# Patient Record
Sex: Female | Born: 1950
Health system: Southern US, Community
[De-identification: ages and names within clinical notes are randomized; demographics above are authoritative.]

## PROBLEM LIST (undated history)

## (undated) DIAGNOSIS — M1990 Unspecified osteoarthritis, unspecified site: Secondary | ICD-10-CM

## (undated) DIAGNOSIS — F329 Major depressive disorder, single episode, unspecified: Secondary | ICD-10-CM

## (undated) DIAGNOSIS — I2510 Atherosclerotic heart disease of native coronary artery without angina pectoris: Secondary | ICD-10-CM

## (undated) DIAGNOSIS — F32A Depression, unspecified: Secondary | ICD-10-CM

## (undated) DIAGNOSIS — K219 Gastro-esophageal reflux disease without esophagitis: Secondary | ICD-10-CM

## (undated) DIAGNOSIS — J40 Bronchitis, not specified as acute or chronic: Secondary | ICD-10-CM

## (undated) HISTORY — PX: HERNIA REPAIR: SHX51

## (undated) HISTORY — PX: DILATION AND CURETTAGE OF UTERUS: SHX78

## (undated) HISTORY — PX: OTHER SURGICAL HISTORY: SHX169

---

## 1998-10-25 ENCOUNTER — Other Ambulatory Visit: Admission: RE | Admit: 1998-10-25 | Discharge: 1998-10-25 | Payer: Self-pay | Admitting: Emergency Medicine

## 1999-12-04 ENCOUNTER — Other Ambulatory Visit: Admission: RE | Admit: 1999-12-04 | Discharge: 1999-12-04 | Payer: Self-pay | Admitting: Emergency Medicine

## 1999-12-12 ENCOUNTER — Encounter: Payer: Self-pay | Admitting: Emergency Medicine

## 1999-12-12 ENCOUNTER — Encounter: Admission: RE | Admit: 1999-12-12 | Discharge: 1999-12-12 | Payer: Self-pay | Admitting: Emergency Medicine

## 2000-04-25 ENCOUNTER — Emergency Department (HOSPITAL_COMMUNITY): Admission: EM | Admit: 2000-04-25 | Discharge: 2000-04-25 | Payer: Self-pay | Admitting: Emergency Medicine

## 2000-12-17 ENCOUNTER — Other Ambulatory Visit: Admission: RE | Admit: 2000-12-17 | Discharge: 2000-12-17 | Payer: Self-pay | Admitting: Emergency Medicine

## 2000-12-31 ENCOUNTER — Encounter: Payer: Self-pay | Admitting: Emergency Medicine

## 2000-12-31 ENCOUNTER — Encounter: Admission: RE | Admit: 2000-12-31 | Discharge: 2000-12-31 | Payer: Self-pay | Admitting: Emergency Medicine

## 2001-01-15 ENCOUNTER — Emergency Department (HOSPITAL_COMMUNITY): Admission: EM | Admit: 2001-01-15 | Discharge: 2001-01-15 | Payer: Self-pay | Admitting: Emergency Medicine

## 2001-01-15 ENCOUNTER — Encounter: Payer: Self-pay | Admitting: Emergency Medicine

## 2002-02-24 ENCOUNTER — Encounter: Admission: RE | Admit: 2002-02-24 | Discharge: 2002-02-24 | Payer: Self-pay | Admitting: Emergency Medicine

## 2002-02-24 ENCOUNTER — Encounter: Payer: Self-pay | Admitting: Emergency Medicine

## 2003-08-29 ENCOUNTER — Encounter: Admission: RE | Admit: 2003-08-29 | Discharge: 2003-08-29 | Payer: Self-pay | Admitting: Emergency Medicine

## 2003-08-30 ENCOUNTER — Encounter: Admission: RE | Admit: 2003-08-30 | Discharge: 2003-08-30 | Payer: Self-pay | Admitting: Emergency Medicine

## 2003-11-07 ENCOUNTER — Ambulatory Visit (HOSPITAL_COMMUNITY): Admission: RE | Admit: 2003-11-07 | Discharge: 2003-11-07 | Payer: Self-pay | Admitting: Chiropractic Medicine

## 2005-09-11 ENCOUNTER — Encounter: Admission: RE | Admit: 2005-09-11 | Discharge: 2005-09-11 | Payer: Self-pay | Admitting: Emergency Medicine

## 2005-09-18 ENCOUNTER — Encounter: Admission: RE | Admit: 2005-09-18 | Discharge: 2005-09-18 | Payer: Self-pay | Admitting: Emergency Medicine

## 2005-11-29 ENCOUNTER — Emergency Department (HOSPITAL_COMMUNITY): Admission: EM | Admit: 2005-11-29 | Discharge: 2005-11-29 | Payer: Self-pay | Admitting: Emergency Medicine

## 2007-11-03 ENCOUNTER — Encounter: Payer: Self-pay | Admitting: Internal Medicine

## 2007-11-08 ENCOUNTER — Ambulatory Visit: Payer: Self-pay | Admitting: Internal Medicine

## 2007-11-08 DIAGNOSIS — R498 Other voice and resonance disorders: Secondary | ICD-10-CM | POA: Insufficient documentation

## 2007-11-08 DIAGNOSIS — R05 Cough: Secondary | ICD-10-CM | POA: Insufficient documentation

## 2007-11-24 ENCOUNTER — Encounter: Payer: Self-pay | Admitting: Internal Medicine

## 2007-12-01 ENCOUNTER — Ambulatory Visit: Payer: Self-pay | Admitting: Internal Medicine

## 2007-12-08 ENCOUNTER — Encounter: Payer: Self-pay | Admitting: Internal Medicine

## 2007-12-08 ENCOUNTER — Ambulatory Visit (HOSPITAL_COMMUNITY): Admission: RE | Admit: 2007-12-08 | Discharge: 2007-12-08 | Payer: Self-pay | Admitting: Internal Medicine

## 2007-12-29 ENCOUNTER — Ambulatory Visit: Payer: Self-pay | Admitting: Internal Medicine

## 2007-12-30 ENCOUNTER — Ambulatory Visit: Payer: Self-pay | Admitting: Internal Medicine

## 2007-12-30 LAB — CONVERTED CEMR LAB
Basophils Absolute: 0 10*3/uL (ref 0.0–0.1)
Basophils Relative: 0.6 % (ref 0.0–1.0)
Eosinophils Absolute: 0.1 10*3/uL (ref 0.0–0.7)
Eosinophils Relative: 1.7 % (ref 0.0–5.0)
HCT: 37.7 % (ref 36.0–46.0)
Hemoglobin: 13.6 g/dL (ref 12.0–15.0)
IgE (Immunoglobulin E), Serum: 2.6 intl units/mL (ref 0.0–180.0)
Lymphocytes Relative: 23 % (ref 12.0–46.0)
MCHC: 36 g/dL (ref 30.0–36.0)
MCV: 93.5 fL (ref 78.0–100.0)
Monocytes Absolute: 0.3 10*3/uL (ref 0.1–1.0)
Monocytes Relative: 7.5 % (ref 3.0–12.0)
Neutro Abs: 3 10*3/uL (ref 1.4–7.7)
Neutrophils Relative %: 67.2 % (ref 43.0–77.0)
Platelets: 240 10*3/uL (ref 150–400)
RBC: 4.03 M/uL (ref 3.87–5.11)
RDW: 12.8 % (ref 11.5–14.6)
WBC: 4.4 10*3/uL — ABNORMAL LOW (ref 4.5–10.5)

## 2008-01-04 ENCOUNTER — Ambulatory Visit: Payer: Self-pay | Admitting: Internal Medicine

## 2008-01-19 ENCOUNTER — Encounter: Payer: Self-pay | Admitting: Internal Medicine

## 2008-02-13 ENCOUNTER — Ambulatory Visit: Payer: Self-pay | Admitting: Internal Medicine

## 2008-04-16 ENCOUNTER — Ambulatory Visit: Payer: Self-pay | Admitting: Internal Medicine

## 2008-07-17 ENCOUNTER — Ambulatory Visit: Payer: Self-pay | Admitting: Family Medicine

## 2008-07-17 DIAGNOSIS — J0100 Acute maxillary sinusitis, unspecified: Secondary | ICD-10-CM

## 2009-01-29 ENCOUNTER — Encounter: Admission: RE | Admit: 2009-01-29 | Discharge: 2009-01-29 | Payer: Self-pay | Admitting: Family Medicine

## 2009-07-02 ENCOUNTER — Ambulatory Visit: Payer: Self-pay | Admitting: Family Medicine

## 2009-07-02 DIAGNOSIS — N3000 Acute cystitis without hematuria: Secondary | ICD-10-CM

## 2009-07-02 LAB — CONVERTED CEMR LAB
Bilirubin Urine: NEGATIVE
Glucose, Urine, Semiquant: NEGATIVE
Ketones, urine, test strip: NEGATIVE
Nitrite: NEGATIVE
Protein, U semiquant: 30
Specific Gravity, Urine: 1.01
Urobilinogen, UA: 0.2
pH: 6

## 2009-07-09 ENCOUNTER — Encounter: Payer: Self-pay | Admitting: Family Medicine

## 2009-12-13 ENCOUNTER — Ambulatory Visit: Payer: Self-pay | Admitting: Family Medicine

## 2009-12-13 DIAGNOSIS — J069 Acute upper respiratory infection, unspecified: Secondary | ICD-10-CM | POA: Insufficient documentation

## 2010-01-30 ENCOUNTER — Encounter: Admission: RE | Admit: 2010-01-30 | Discharge: 2010-01-30 | Payer: Self-pay | Admitting: Family Medicine

## 2010-08-24 ENCOUNTER — Encounter: Payer: Self-pay | Admitting: Emergency Medicine

## 2010-09-04 NOTE — Assessment & Plan Note (Signed)
Summary: Hoarness, L ear pressure, chest congestion x 4 dys rm 1   Vital Signs:  Patient Profile:   60 Years Old Female CC:      Cold & URI symptoms Height:     64.25 inches Weight:      147 pounds O2 Sat:      99 % O2 treatment:    Room Air Temp:     99.7 degrees F oral Pulse rate:   91 / minute Pulse rhythm:   regular Resp:     18 per minute BP sitting:   127 / 75  (right arm) Cuff size:   regular  Vitals Entered By: Areta Haber CMA (Dec 13, 2009 4:16 PM)                  Current Allergies: ! SULFA ! * DUST, TREE POLLEN, GRASSES     History of Present Illness Chief Complaint: Cold & URI symptoms History of Present Illness: Subjective: Patient complains of URI symptoms for 4 to 5 days with mild sore throat, sinus congestion, and non-productive cough.  She has a history of seasonal allergies and requests a refill of her Astepro and QVAR  No pleuritic pain + wheezing + post-nasal drainage No sinus pain/pressure No itchy/red eyes No earache but left ear fees pressure No hemoptysis No SOB No fever/chills No nausea No vomiting No abdominal pain No diarrhea No skin rashes + fatigue No myalgias No headache Used OTC meds without relief   Current Problems: URI (ICD-465.9) ACUTE CYSTITIS (ICD-595.0) ACUTE MAXILLARY SINUSITIS (ICD-461.0) DYSPHONIA (ICD-784.49) COUGH (ICD-786.2)   Current Meds MELOXICAM 15 MG  TABS (MELOXICAM) once daily FOSAMAX 70 MG  TABS (ALENDRONATE SODIUM) once a week QVAR 80 MCG/ACT  AERS (BECLOMETHASONE DIPROPIONATE) once daily as needed PROZAC 20 MG CAPS (FLUOXETINE HCL) 1 tab by mouth once daily MULTIVITAMINS  CAPS (MULTIPLE VITAMIN) QD * VITAMIN B12 QD * CALCIUM 1200mg  QD * GLUCOSAMINE WITH MSR QD MINOXIDIL 2 % SOLN (MINOXIDIL) 2 gtt twice daily ALLEGRA 180 MG TABS (FEXOFENADINE HCL) as needed BENZONATATE 200 MG CAPS (BENZONATATE) One by mouth hs as needed cough PREDNISONE 10 MG TABS (PREDNISONE) 2 PO today, then 2  BID  for 2 days, then 1 two times a day for 2 days, then 1 daily for 2 days.  Take PC AZITHROMYCIN 250 MG TABS (AZITHROMYCIN) Two tabs by mouth on day 1, then 1 tab daily on days 2 through 5 QVAR 80 MCG/ACT AERS (BECLOMETHASONE DIPROPIONATE) 2 inh two times a day ASTEPRO 0.15 % SOLN (AZELASTINE HCL) 2 sprays in each nostril bid  REVIEW OF SYSTEMS Constitutional Symptoms      Denies fever, chills, night sweats, weight loss, weight gain, and fatigue.  Eyes       Denies change in vision, eye pain, eye discharge, glasses, contact lenses, and eye surgery. Ear/Nose/Throat/Mouth       Complains of ear pain and hoarseness.      Denies hearing loss/aids, change in hearing, ear discharge, dizziness, frequent runny nose, frequent nose bleeds, sinus problems, sore throat, and tooth pain or bleeding.      Comments: pressure L x 4 dys Respiratory       Complains of wheezing.      Denies dry cough, productive cough, shortness of breath, asthma, bronchitis, and emphysema/COPD.      Comments: chest congestion Cardiovascular       Denies murmurs, chest pain, and tires easily with exhertion.    Gastrointestinal  Denies stomach pain, nausea/vomiting, diarrhea, constipation, blood in bowel movements, and indigestion. Genitourniary       Denies painful urination, kidney stones, and loss of urinary control. Neurological       Denies paralysis, seizures, and fainting/blackouts. Musculoskeletal       Denies muscle pain, joint pain, joint stiffness, decreased range of motion, redness, swelling, muscle weakness, and gout.  Skin       Denies bruising, unusual mles/lumps or sores, and hair/skin or nail changes.  Psych       Denies mood changes, temper/anger issues, anxiety/stress, speech problems, depression, and sleep problems. Other Comments: Pt jhas not seen PCP for this.   Past History:  Past Medical History: Last updated: 02/13/2008 Mild arthritis Osteopenia Lumbar disc disease Clinical  depression  Past Surgical History: Last updated: 12/30/2007 D&C after miscarriage 1994  Family History: Last updated: 12/30/2007 allergies;;;mother, sisters asthma....children and sisters clotting disorders-sister Mother- living age 45; hypertension, macular den, skin cancer, afib(pace maker) Father- living age 55; artifical valve, hyperlipidemia 3 Sisters- living ages 82- blood clot, age 91, age 41 Brother- living age 31 no medical problems  Social History: Last updated: 11/08/2007 married lives with husband 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  Extraocular movement is intact.  Conjunctivae are not inflamed.  Ears:  Canals normal.  Tympanic membranes normal.   Nose:  Normal septum.  Normal turbinates, mildly congested.    No sinus tenderness present.  Pharynx:  Normal  Neck:  Supple.  No adenopathy is present.  Lungs:  Clear to auscultation.  Breath sounds are equal.  Heart:  Regular rate and rhythm without murmurs, rubs, or gallops.  Abdomen:  Nontender without masses or hepatosplenomegaly.  Bowel sounds are present.  No CVA or flank tenderness.  Extremities:  No edema.  Skin:  No rash Assessment New Problems: URI (ICD-465.9)  VIRAL URI WITH BRONCHOSPASM  Plan New Medications/Changes: ASTEPRO 0.15 % SOLN (AZELASTINE HCL) 2 sprays in each nostril bid  #30cc x 0, 12/13/2009, Donna Christen MD QVAR 80 MCG/ACT AERS (BECLOMETHASONE DIPROPIONATE) 2 inh two times a day  #7.3gm x 0, 12/13/2009, Donna Christen MD AZITHROMYCIN 250 MG TABS (AZITHROMYCIN) Two tabs by mouth on day 1, then 1 tab daily on days 2 through 5  #6 tabs x 0, 12/13/2009, Donna Christen MD PREDNISONE 10 MG TABS (PREDNISONE) 2 PO today,  then 2  BID for 2 days, then 1 two times a day for 2 days, then 1 daily for 2 days.  Take PC  #16 x 0, 12/13/2009, Donna Christen MD BENZONATATE 200 MG CAPS (BENZONATATE) One by mouth hs as needed cough  #12 x 0, 12/13/2009, Donna Christen MD  New Orders: Est. Patient Level III [phone] Planning Comments:   Begin tapering course of prednisone.  Expectorant/decongestant daytime, cough suppressant at bedtime. Rx for Z-pack to hold:  add if not improving in about 5 days or persistent fever develops Refill Astepro and ZVAR. Follow-up with PCP if not improving.   The patient and/or caregiver has been counseled thoroughly with regard to medications prescribed including dosage, schedule, interactions, rationale for use, and possible side effects and they verbalize understanding.  Diagnoses and expected course of recovery discussed and will return if not improved as expected or if  the condition worsens. Patient and/or caregiver verbalized understanding.  Prescriptions: ASTEPRO 0.15 % SOLN (AZELASTINE HCL) 2 sprays in each nostril bid  #30cc x 0   Entered and Authorized by:   Donna Christen MD   Signed by:   Donna Christen MD on 12/13/2009   Method used:   Print then Give to Patient   RxID:   [id number] QVAR 80 MCG/ACT AERS (BECLOMETHASONE DIPROPIONATE) 2 inh two times a day  #7.3gm x 0   Entered and Authorized by:   Donna Christen MD   Signed by:   Donna Christen MD on 12/13/2009   Method used:   Print then Give to Patient   RxID:   [id number] AZITHROMYCIN 250 MG TABS (AZITHROMYCIN) Two tabs by mouth on day 1, then 1 tab daily on days 2 through 5  #6 tabs x 0   Entered and Authorized by:   Donna Christen MD   Signed by:   Donna Christen MD on 12/13/2009   Method used:   Print then Give to Patient   RxID:   [id number] PREDNISONE 10 MG TABS (PREDNISONE) 2 PO today, then 2  BID for 2 days, then 1 two times a day for 2 days, then 1 daily for 2 days.  Take PC  #16 x 0   Entered and  Authorized by:   Donna Christen MD   Signed by:   Donna Christen MD on 12/13/2009   Method used:   Print then Give to Patient   RxID:   [id number] BENZONATATE 200 MG CAPS (BENZONATATE) One by mouth hs as needed cough  #12 x 0   Entered and Authorized by:   Donna Christen MD   Signed by:   Donna Christen MD on 12/13/2009   Method used:   Print then Give to Patient   RxID:   [id number]   Patient Instructions: 1)  May use Mucinex D (guaifenesin with decongestant) twice daily for congestion. 2)  Increase fluid intake, rest. 3)  May use Afrin nasal spray (or generic oxymetazoline) twice daily for about 5 days.  Also recommend using saline nasal spray several times daily and/or saline nasal irrigation. 4)  Stop Allegra D for now. 5)  Add azithromycin if not improving about 5 days. 6)  Followup with family doctor if not improving 7 to 10 days.

## 2011-07-23 ENCOUNTER — Emergency Department
Admission: EM | Admit: 2011-07-23 | Discharge: 2011-07-23 | Disposition: A | Discharge status: Home / Self Care | Payer: 59 | Source: Home / Self Care | Attending: Emergency Medicine | Admitting: Emergency Medicine

## 2011-07-23 ENCOUNTER — Encounter: Payer: Self-pay | Admitting: *Deleted

## 2011-07-23 DIAGNOSIS — J069 Acute upper respiratory infection, unspecified: Secondary | ICD-10-CM

## 2011-07-23 DIAGNOSIS — J329 Chronic sinusitis, unspecified: Secondary | ICD-10-CM

## 2011-07-23 HISTORY — DX: Major depressive disorder, single episode, unspecified: F32.9

## 2011-07-23 HISTORY — DX: Depression, unspecified: F32.A

## 2011-07-23 MED ORDER — AMOXICILLIN 875 MG PO TABS: 875.0000 mg | ORAL_TABLET | Freq: Two times a day (BID) | ORAL | Status: AC

## 2011-07-23 MED ORDER — MOXIFLOXACIN HCL 400 MG PO TABS: 400.0000 mg | ORAL_TABLET | Freq: Every day | ORAL | Status: AC

## 2011-07-23 MED ORDER — BECLOMETHASONE DIPROPIONATE 80 MCG/ACT IN AERS: 2.0000 | INHALATION_SPRAY | Freq: Two times a day (BID) | RESPIRATORY_TRACT | Status: DC

## 2011-07-23 MED ORDER — AZELASTINE HCL 0.1 % NA SOLN: 2.0000 | Freq: Two times a day (BID) | NASAL | Status: DC

## 2011-07-23 NOTE — ED Provider Notes (Signed)
History     CSN: [id number]  Arrival date & time 07/23/11  1221   First MD Initiated Contact with Patient 07/23/11 1250      Chief Complaint  Patient presents with  . Sinusitis    (Consider location/radiation/quality/duration/timing/severity/associated sxs/prior treatment) HPI Kaitlyn Lozano is a 60 y.o. female who complains of onset of cold symptoms for 4 days. She has not had a flu shot. 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 No sore throat +cough No pleuritic pain No wheezing + nasal congestion + post-nasal drainage + And sinus pain/pressure No chest congestion No itchy/red eyes No earache No hemoptysis No SOB No chills/sweats No fever No nausea No vomiting No abdominal pain No diarrhea No skin rashes No fatigue No myalgias No headache    Past Medical History  Diagnosis Date  . Depression     Past Surgical History  Procedure Date  . Dnc     Family History  Problem Relation Age of Onset  . Atrial fibrillation Mother   . Transient ischemic attack Mother     History  Substance Use Topics  . Smoking status: Never Smoker   . Smokeless tobacco: Not on file  . Alcohol Use: Yes    OB History    Grav Para Term Preterm Abortions TAB SAB Ect Mult Living                  Review of Systems  Allergies  Sulfonamide derivatives  Home Medications   Current Outpatient Rx  Name Route Sig Dispense Refill  . FLUOXETINE HCL 20 MG PO TABS Oral Take 20 mg by mouth daily.      . AMOXICILLIN 875 MG PO TABS Oral Take 1 tablet (875 mg total) by mouth 2 (two) times daily. 14 tablet 0  . AZELASTINE HCL 137 MCG/SPRAY NA SOLN Nasal Place 2 sprays into the nose 2 (two) times daily. Use in each nostril as directed 30 mL 2  . BECLOMETHASONE DIPROPIONATE 80 MCG/ACT IN AERS Inhalation Inhale 2 puffs into the lungs 2 (two) times daily. 1 Inhaler 2  . MOXIFLOXACIN HCL 400 MG PO TABS Oral Take 1 tablet (400 mg total) by mouth daily. 10 tablet 0    BP 139/83   Pulse 91  Temp(Src) 99.2 F (37.3 C) (Oral)  Resp 14  Ht 5' 4.5" (1.638 m)  Wt 144 lb (65.318 kg)  BMI 24.34 kg/m2  SpO2 100%  Physical Exam  Nursing note and vitals reviewed. Constitutional: She is oriented to person, place, and time. She appears well-developed and well-nourished.  HENT:  Head: Normocephalic and atraumatic.  Right Ear: Tympanic membrane, external ear and ear canal normal.  Left Ear: Tympanic membrane, external ear and ear canal normal.  Nose: Mucosal edema and rhinorrhea present.  Mouth/Throat: Posterior oropharyngeal erythema present. No oropharyngeal exudate or posterior oropharyngeal edema.  Eyes: No scleral icterus.  Neck: Neck supple.  Cardiovascular: Regular rhythm and normal heart sounds.   Pulmonary/Chest: Effort normal and breath sounds normal. No respiratory distress.  Neurological: She is alert and oriented to person, place, and time.  Skin: Skin is warm and dry.  Psychiatric: She has a normal mood and affect. Her speech is normal.    ED Course  Procedures (including critical care time)  Labs Reviewed - No data to display No results found.   1. Sinusitis   2. Acute upper respiratory infections of unspecified site       MDM  1)  Take the  prescribed antibiotic as instructed.  I also refilled her Astelin and Qvar per her request but she will need to get further refills from her primary care Dr. 2)  Use nasal saline solution (over the counter) at least 3 times a day. 3)  Use over the counter decongestants like Zyrtec-D every 12 hours as needed to help with congestion.  If you have hypertension, do not take medicines with sudafed.  4)  Can take tylenol every 6 hours or motrin every 8 hours for pain or fever. 5)  Follow up with your primary doctor if no improvement in 5-7 days, sooner if increasing pain, fever, or new symptoms.     Lily Kocher, MD 07/23/11 1320

## 2011-07-23 NOTE — ED Notes (Signed)
Taken sudafed and airborne. No fever.

## 2011-09-15 ENCOUNTER — Encounter: Payer: Self-pay | Admitting: *Deleted

## 2011-09-15 ENCOUNTER — Emergency Department
Admission: EM | Admit: 2011-09-15 | Discharge: 2011-09-15 | Disposition: A | Discharge status: Home / Self Care | Payer: 59 | Source: Home / Self Care | Attending: Emergency Medicine | Admitting: Emergency Medicine

## 2011-09-15 DIAGNOSIS — R05 Cough: Secondary | ICD-10-CM

## 2011-09-15 DIAGNOSIS — J209 Acute bronchitis, unspecified: Secondary | ICD-10-CM

## 2011-09-15 HISTORY — DX: Bronchitis, not specified as acute or chronic: J40

## 2011-09-15 MED ORDER — AZITHROMYCIN 250 MG PO TABS: ORAL_TABLET | ORAL | Status: AC

## 2011-09-15 NOTE — ED Notes (Signed)
Patient presents with a cough, wheezing and hoarseness. Cough for 2 day and 1 day for wheezing and hoarseness. Denies fever or chills. She states no OTC medication for symptoms. Symptoms getting worse.

## 2011-09-15 NOTE — ED Provider Notes (Signed)
History     CSN: [id number]  Arrival date & time 09/15/11  1607   First MD Initiated Contact with Patient 09/15/11 1623      Chief Complaint  Patient presents with  . Cough    cough x 2 days with wheezing for 1 day    (Consider location/radiation/quality/duration/timing/severity/associated sxs/prior treatment) HPI Kaitlyn Lozano is a 61 y.o. female who complains of onset of cold symptoms for a few days.  No sore throat + cough No pleuritic pain No wheezing No nasal congestion No post-nasal drainage No sinus pain/pressure + chest congestion No itchy/red eyes No earache No hemoptysis No SOB No chills/sweats No fever No nausea No vomiting No abdominal pain No diarrhea No skin rashes No fatigue No myalgias No headache    Past Medical History  Diagnosis Date  . Depression   . Depression   . Bronchitis     eosinophilic    Past Surgical History  Procedure Date  . Dnc     Family History  Problem Relation Age of Onset  . Atrial fibrillation Mother   . Transient ischemic attack Mother   . Hypertension Mother   . Stroke Mother     History  Substance Use Topics  . Smoking status: Never Smoker   . Smokeless tobacco: Not on file  . Alcohol Use: Yes     rare    OB History    Grav Para Term Preterm Abortions TAB SAB Ect Mult Living                  Review of Systems  All other systems reviewed and are negative.    Allergies  Sulfonamide derivatives  Home Medications   Current Outpatient Rx  Name Route Sig Dispense Refill  . BECLOMETHASONE DIPROPIONATE 80 MCG/ACT IN AERS Inhalation Inhale 2 puffs into the lungs 2 (two) times daily. 1 Inhaler 2  . FLUOXETINE HCL 20 MG PO TABS Oral Take 20 mg by mouth daily.      . AZELASTINE HCL 137 MCG/SPRAY NA SOLN Nasal Place 2 sprays into the nose 2 (two) times daily. Use in each nostril as directed 30 mL 2    BP 147/83  Pulse 86  Temp(Src) 98.3 F (36.8 C) (Oral)  Resp 17  Ht 5\' 4"  (1.626 m)  Wt 145 lb  (65.772 kg)  BMI 24.89 kg/m2  SpO2 98%  Physical Exam  Nursing note and vitals reviewed. Constitutional: She is oriented to person, place, and time. She appears well-developed and well-nourished.  HENT:  Head: Normocephalic and atraumatic.  Right Ear: Tympanic membrane, external ear and ear canal normal.  Left Ear: Tympanic membrane, external ear and ear canal normal.  Nose: Mucosal edema and rhinorrhea present.  Mouth/Throat: Posterior oropharyngeal erythema present. No oropharyngeal exudate or posterior oropharyngeal edema.  Eyes: No scleral icterus.  Neck: Neck supple.  Cardiovascular: Regular rhythm and normal heart sounds.   Pulmonary/Chest: Effort normal. No respiratory distress. She has no wheezes. She has rhonchi (1 rhonchi, cleared with coughing.).  Neurological: She is alert and oriented to person, place, and time.  Skin: Skin is warm and dry.  Psychiatric: She has a normal mood and affect. Her speech is normal.    ED Course  Procedures (including critical care time)  Labs Reviewed - No data to display No results found.   No diagnosis found.    MDM  1)  Take the prescribed antibiotic as instructed. 2)  Use nasal saline solution (over the counter) at least  3 times a day. 3)  Use over the counter decongestants like Zyrtec-D every 12 hours as needed to help with congestion.  If you have hypertension, do not take medicines with sudafed.  4)  Can take tylenol every 6 hours or motrin every 8 hours for pain or fever. 5)  Follow up with your primary doctor if no improvement in 5-7 days, sooner if increasing pain, fever, or new symptoms.     Lily Kocher, MD 09/15/11 [phone]

## 2012-02-24 ENCOUNTER — Other Ambulatory Visit: Payer: Self-pay | Admitting: Family Medicine

## 2012-02-24 DIAGNOSIS — Z1231 Encounter for screening mammogram for malignant neoplasm of breast: Secondary | ICD-10-CM

## 2012-03-07 ENCOUNTER — Ambulatory Visit
Admission: RE | Admit: 2012-03-07 | Discharge: 2012-03-07 | Disposition: A | Discharge status: Home / Self Care | Payer: 59 | Source: Ambulatory Visit | Attending: Family Medicine | Admitting: Family Medicine

## 2012-03-07 DIAGNOSIS — Z1231 Encounter for screening mammogram for malignant neoplasm of breast: Secondary | ICD-10-CM

## 2012-09-21 ENCOUNTER — Encounter: Payer: Self-pay | Admitting: *Deleted

## 2012-09-21 ENCOUNTER — Emergency Department (INDEPENDENT_AMBULATORY_CARE_PROVIDER_SITE_OTHER)
Admission: EM | Admit: 2012-09-21 | Discharge: 2012-09-21 | Disposition: A | Discharge status: Home / Self Care | Payer: 59 | Source: Home / Self Care | Attending: Family Medicine | Admitting: Family Medicine

## 2012-09-21 DIAGNOSIS — S39012A Strain of muscle, fascia and tendon of lower back, initial encounter: Secondary | ICD-10-CM

## 2012-09-21 DIAGNOSIS — S335XXA Sprain of ligaments of lumbar spine, initial encounter: Secondary | ICD-10-CM

## 2012-09-21 DIAGNOSIS — M5136 Other intervertebral disc degeneration, lumbar region: Secondary | ICD-10-CM

## 2012-09-21 MED ORDER — PREDNISONE 50 MG PO TABS: ORAL_TABLET | ORAL | Status: DC

## 2012-09-21 MED ORDER — CYCLOBENZAPRINE HCL 10 MG PO TABS: 10.0000 mg | ORAL_TABLET | Freq: Three times a day (TID) | ORAL | Status: DC | PRN
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
## 2012-09-21 NOTE — ED Notes (Addendum)
Pt c/o right lower back excruciating pain x 1 wk. States she has problems with bulging discs (5). Has tried Aleve with little help. States had a MRI 1 1/2 years ago.

## 2012-10-07 ENCOUNTER — Ambulatory Visit (INDEPENDENT_AMBULATORY_CARE_PROVIDER_SITE_OTHER): Payer: Self-pay | Admitting: General Surgery

## 2012-10-18 ENCOUNTER — Other Ambulatory Visit (INDEPENDENT_AMBULATORY_CARE_PROVIDER_SITE_OTHER): Payer: Self-pay | Admitting: General Surgery

## 2012-10-18 ENCOUNTER — Ambulatory Visit (INDEPENDENT_AMBULATORY_CARE_PROVIDER_SITE_OTHER): Payer: 59 | Admitting: General Surgery

## 2012-10-18 ENCOUNTER — Encounter (INDEPENDENT_AMBULATORY_CARE_PROVIDER_SITE_OTHER): Payer: Self-pay | Admitting: General Surgery

## 2012-10-18 DIAGNOSIS — G8929 Other chronic pain: Secondary | ICD-10-CM

## 2012-10-18 DIAGNOSIS — K439 Ventral hernia without obstruction or gangrene: Secondary | ICD-10-CM

## 2012-10-18 DIAGNOSIS — R1031 Right lower quadrant pain: Secondary | ICD-10-CM

## 2012-10-18 NOTE — Progress Notes (Signed)
Subjective:     Patient ID: Kaitlyn Lozano, female   DOB: 06/22/1951, 62 y.o.   MRN: [id number]  HPI The patient is a 62 year old female who is referred by Dr. Aida Puffer for evaluation ruling out of a spigelian hernia. Patient states she's had back pain he was evaluated by Dr. Clarene Duke secondary to this, however on exam he noticed a bulge in her right lower quadrant. The patient states that she has not had pain is not limited by activities. The only time she does have pain is upon direct palpation needle lateral border  Of her right rectus.  Review of Systems  Constitutional: Negative.   HENT: Negative.   Respiratory: Negative.   Cardiovascular: Negative.   Gastrointestinal: Negative.   Endocrine: Negative.   Neurological: Negative.        Objective:   Physical Exam  Constitutional: She is oriented to person, place, and time. She appears well-developed and well-nourished.  HENT:  Head: Normocephalic and atraumatic.  Eyes: Conjunctivae and EOM are normal. Pupils are equal, round, and reactive to light.  Neck: Normal range of motion. Neck supple.  Cardiovascular: Normal rate, regular rhythm and normal heart sounds.   Pulmonary/Chest: Effort normal and breath sounds normal.  Abdominal: Soft. Bowel sounds are normal. She exhibits no distension and no mass. There is tenderness (RLQ lateral edge of the right rectus). There is no rebound and no guarding.  Musculoskeletal: Normal range of motion.  Neurological: She is alert and oriented to person, place, and time.       Assessment:     62 year old female with possible right spigelian hernia.     Plan:     1. We will have the patient undergo a CT scan of her abdomen and pelvis to evaluate for a possible spigelian hernia. Should this not be diagnostic will have her followup and talk about diagnostic laparoscopy with possible hernia fixation.  2. Followup after CT scan in 1-2 weeks.

## 2012-10-21 ENCOUNTER — Ambulatory Visit
Admission: RE | Admit: 2012-10-21 | Discharge: 2012-10-21 | Disposition: A | Discharge status: Home / Self Care | Payer: 59 | Source: Ambulatory Visit | Attending: General Surgery | Admitting: General Surgery

## 2012-10-21 DIAGNOSIS — K439 Ventral hernia without obstruction or gangrene: Secondary | ICD-10-CM

## 2012-10-21 MED ORDER — IOHEXOL 300 MG/ML  SOLN
100.0000 mL | Freq: Once | INTRAMUSCULAR | Status: AC | PRN
  Administered 2012-10-21: 100 mL via INTRAVENOUS

## 2012-11-09 ENCOUNTER — Ambulatory Visit (INDEPENDENT_AMBULATORY_CARE_PROVIDER_SITE_OTHER): Payer: 59 | Admitting: General Surgery

## 2012-11-09 ENCOUNTER — Encounter (INDEPENDENT_AMBULATORY_CARE_PROVIDER_SITE_OTHER): Payer: Self-pay | Admitting: General Surgery

## 2012-11-09 VITALS — BP 116/82 | HR 73 | Temp 97.1°F | Resp 16 | Ht 66.0 in | Wt 140.4 lb

## 2012-11-09 DIAGNOSIS — K439 Ventral hernia without obstruction or gangrene: Secondary | ICD-10-CM

## 2012-11-09 NOTE — Progress Notes (Signed)
Patient ID: Kaitlyn Lozano, female   DOB: Jul 10, 1951, 62 y.o.   MRN: [id number] A 62 year old female who was recently seen for reevaluation of his beginning hernia. Patient underwent CT scan of the abdomen and pelvis which revealed a normal scan of the abdomen and pelvis. There is no hernia could be seen. There is no subcutaneous mass that could be seen on CT scan. The patient states shea bulge with discomfort. She has some discomfort but no pain.    On exam: The patient does have a slight asymmetry the right side of her lower abdomen. She has no overt pain on palpation. There is no hernia on palpation.  Assessment and plan: Patient is a 62 year old female with a possible spigelian hernia versus subcutaneous mass.  At this point the discomfort does not limited her activities.  The patient would like to continue with watchful waiting at this time. We discussed the possibility to proceed with a diagnostic laparoscopy, but the patient does not feel that the benefits out  weigh the risks of negative diagnostic laparoscopy.    The patient will followup as needed. She will call for a followup appointment in 2-3 months.

## 2012-11-15 ENCOUNTER — Encounter (INDEPENDENT_AMBULATORY_CARE_PROVIDER_SITE_OTHER): Payer: 59 | Admitting: General Surgery

## 2012-11-29 ENCOUNTER — Telehealth (INDEPENDENT_AMBULATORY_CARE_PROVIDER_SITE_OTHER): Payer: Self-pay | Admitting: General Surgery

## 2012-11-29 NOTE — Telephone Encounter (Signed)
         Lozano, Kaitlyn - Dr. Derrell Lolling ','<More Detail >>       Dr. 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   Would like to go ahead and schedule sx. Thx

## 2012-11-29 NOTE — Telephone Encounter (Signed)
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

## 2012-11-30 ENCOUNTER — Telehealth (INDEPENDENT_AMBULATORY_CARE_PROVIDER_SITE_OTHER): Payer: Self-pay | Admitting: General Surgery

## 2012-11-30 NOTE — Telephone Encounter (Signed)
Patient called to see what AR said after I told him her symptoms and wanting to go ahead with surgery.Marland KitchenMarland KitchenI told her that I had discussed everything that she had told me on Monday afternoon such as having a pinching feeling at all times and the hernia was always on her mind... and the surgery orders were in the surgery schedulers box and that they should be calling to set a date within the next couple of days..patient seemed fine with all

## 2012-12-09 ENCOUNTER — Ambulatory Visit (INDEPENDENT_AMBULATORY_CARE_PROVIDER_SITE_OTHER): Payer: 59 | Admitting: General Surgery

## 2012-12-09 ENCOUNTER — Encounter (INDEPENDENT_AMBULATORY_CARE_PROVIDER_SITE_OTHER): Payer: Self-pay | Admitting: General Surgery

## 2012-12-09 DIAGNOSIS — R1031 Right lower quadrant pain: Secondary | ICD-10-CM

## 2012-12-09 DIAGNOSIS — G8929 Other chronic pain: Secondary | ICD-10-CM

## 2012-12-09 NOTE — Progress Notes (Signed)
Patient ID: Kaitlyn Lozano, female   DOB: [birth year], 62 y.o.   MRN: [id number] The patient is a 62 year old female with a questionable spigelian hernia on the right lateral side of her abdomen. The patient wanted more information in regards to the surgery. All questions were answered. The patient was set for surgery on May 23.   the patient requested to speak with the financial advisor in clinic.

## 2012-12-23 DIAGNOSIS — K439 Ventral hernia without obstruction or gangrene: Secondary | ICD-10-CM

## 2012-12-28 ENCOUNTER — Telehealth (INDEPENDENT_AMBULATORY_CARE_PROVIDER_SITE_OTHER): Payer: Self-pay | Admitting: *Deleted

## 2012-12-28 NOTE — Telephone Encounter (Signed)
Patient called extremely upset because she thought she would be able to return to work today and not have any pain.  Patient states she doesn't feel like she can work due to the amount of pain she is having.  Patient states she is going to go home.  Norco 5/325mg  1-2 tablets every 4-6 hours as needed for pain #30 no refills. [facility] [phone].

## 2013-01-06 ENCOUNTER — Encounter (INDEPENDENT_AMBULATORY_CARE_PROVIDER_SITE_OTHER): Payer: Self-pay | Admitting: General Surgery

## 2013-01-06 ENCOUNTER — Ambulatory Visit (INDEPENDENT_AMBULATORY_CARE_PROVIDER_SITE_OTHER): Payer: 59 | Admitting: General Surgery

## 2013-01-06 VITALS — BP 110/68 | HR 77 | Temp 98.2°F | Resp 17 | Ht 64.5 in | Wt 139.2 lb

## 2013-01-06 DIAGNOSIS — Z09 Encounter for follow-up examination after completed treatment for conditions other than malignant neoplasm: Secondary | ICD-10-CM

## 2013-01-06 NOTE — Progress Notes (Signed)
Patient ID: Kaitlyn Lozano, female   DOB: 21-Oct-1950, 62 y.o.   MRN: [id number] The patient is a 62 year old female status post bilateral spigelian hernia repair. The patient initially had pain postoperatively but is since become better. Patient had no nausea or vomiting or fevers while at home. Patient is back to work at this time and doing well.  On exam: Wound is clean dry and intact there is no hernia on palpation  Laparoscopic photos were discussed with the patient in detail.  62 year old female status post fluoroscopic bilateral spigelian hernia repair. 1. We'll have patient followup in 6 weeks. 2. Patient is doing well until she does he does not a followup she can cancel this appointment. 3. Were discussed with the instructions for one more month.  The patient is able 2 resume running/biking/elliptical trainer. Cannot recommend any subsequent use.

## 2013-01-16 ENCOUNTER — Telehealth (INDEPENDENT_AMBULATORY_CARE_PROVIDER_SITE_OTHER): Payer: Self-pay | Admitting: *Deleted

## 2013-01-16 ENCOUNTER — Encounter (INDEPENDENT_AMBULATORY_CARE_PROVIDER_SITE_OTHER): Payer: Self-pay | Admitting: General Surgery

## 2013-01-16 ENCOUNTER — Ambulatory Visit (INDEPENDENT_AMBULATORY_CARE_PROVIDER_SITE_OTHER): Payer: 59 | Admitting: General Surgery

## 2013-01-16 VITALS — BP 116/64 | HR 96 | Temp 98.1°F | Resp 14 | Ht 64.5 in | Wt 141.4 lb

## 2013-01-16 DIAGNOSIS — M62838 Other muscle spasm: Secondary | ICD-10-CM

## 2013-01-16 MED ORDER — DIAZEPAM 5 MG PO TABS: 5.0000 mg | ORAL_TABLET | Freq: Four times a day (QID) | ORAL | Status: DC | PRN

## 2013-01-16 NOTE — Progress Notes (Signed)
Patient ID: Kaitlyn Lozano, female   DOB: 11/03/1950, 63 y.o.   MRN: [id number] The patient is a 62 year old female status post bilateral spigelian hernia repair. The patient states that 2 days ago she had right lower quadrant pain that was on off. She describes a while using her abdominal musculature the pain became more noticeable. She states that it would be sharp and slowly relax. This pain is not constant. She does not describe a sheet or burning pain.  On exam: There is no hernia on palpation.  Assessment and plan: 62 year old female status post lap bilateral spigelian hernia repair At this time I believe the patient does have muscle spasms in her right rectus abdominis muscle where her stitch was to oppose the spigelian hernia. 1. I will give the patient prescription for Valium for muscle relaxer. Should this not help relieve the pain the patient can call back and

## 2013-01-16 NOTE — Telephone Encounter (Signed)
Patient called to state she has been doing well with recovery however beginning yesterday starting having a new pain in her right lower quadrant that she is very concerned about.  Spoke to [place] and Dr. Derrell Lolling who stated to schedule for patient to come on in this morning.  Patient agreeable with POC and on her way at this time.

## 2013-01-24 ENCOUNTER — Ambulatory Visit (INDEPENDENT_AMBULATORY_CARE_PROVIDER_SITE_OTHER): Payer: 59 | Admitting: General Surgery

## 2013-01-24 ENCOUNTER — Encounter (INDEPENDENT_AMBULATORY_CARE_PROVIDER_SITE_OTHER): Payer: Self-pay | Admitting: General Surgery

## 2013-01-24 VITALS — BP 130/80 | HR 72 | Temp 98.4°F | Resp 16 | Ht 64.5 in | Wt 140.2 lb

## 2013-01-24 DIAGNOSIS — Z09 Encounter for follow-up examination after completed treatment for conditions other than malignant neoplasm: Secondary | ICD-10-CM

## 2013-01-24 MED ORDER — GABAPENTIN 300 MG PO CAPS: 300.0000 mg | ORAL_CAPSULE | Freq: Three times a day (TID) | ORAL | Status: DC

## 2013-01-24 MED ORDER — OXYCODONE-ACETAMINOPHEN 10-325 MG PO TABS: 1.0000 | ORAL_TABLET | Freq: Four times a day (QID) | ORAL | Status: DC | PRN

## 2013-01-24 NOTE — Progress Notes (Signed)
Patient ID: Kaitlyn Lozano, female   DOB: 08-13-1950, 62 y.o.   MRN: [id number] The patient is a 62 year old female status post bilateral spigelian hernia repair.  Patient was recently seen secondary a right lower quadrant pain.  The subcutaneous muscle spasm was given Valium for muscle spasms.  The patient's fascia is continued having some burning sensation in her right hernia site. She states that they're certain movements that provoke a burning sensation. The pain slowly resolved after several minutes.  At this time the patient has not begin any exercise activity.  On exam: Wounds are clean dry and intact No hernia on palpation  Assessment and plan: This 62 year old female status post bilateral speglian hernia repair, currently likely post up neuralgia 1 I will give the patient a prescription for Neurontin to help with her pain. 2. At this time I believe it's still very early to require any type of diagnostic laparoscopic procedure. 3. I believe  Her neuralgia will resolve with time. I discussed this with the patient and she is reassured at this time.

## 2013-01-24 NOTE — Addendum Note (Signed)
Addended by: Axel Filler on: 01/24/2013 04:28 PM   Modules accepted: Orders

## 2013-02-22 ENCOUNTER — Ambulatory Visit (INDEPENDENT_AMBULATORY_CARE_PROVIDER_SITE_OTHER): Payer: 59 | Admitting: General Surgery

## 2013-02-22 ENCOUNTER — Encounter (INDEPENDENT_AMBULATORY_CARE_PROVIDER_SITE_OTHER): Payer: Self-pay | Admitting: General Surgery

## 2013-02-22 VITALS — BP 118/68 | HR 72 | Temp 98.2°F | Resp 14 | Ht 64.5 in | Wt 142.6 lb

## 2013-02-22 DIAGNOSIS — Z09 Encounter for follow-up examination after completed treatment for conditions other than malignant neoplasm: Secondary | ICD-10-CM

## 2013-02-22 NOTE — Progress Notes (Signed)
Patient ID: Kaitlyn Lozano, female   DOB: 05-27-1951, 62 y.o.   MRN: [id number] The patient is a 62 year old female status post bilateral Spegellian hernia repair. Patient's previous pain issues are currently resolving. She is on Neurontin and feels that that is helping. She is slowly getting back to her normal activity and is having less pain.   On exam: Wounds are clean dry and intact There is no hernia on palpation  Assessment and plan: 62 year old female status post bilateral inguinal hernia repair. 1. Will have patient follow back when necessary 2. Should the patient in the the refill Neurontin we can refill that for her

## 2013-04-04 ENCOUNTER — Telehealth (INDEPENDENT_AMBULATORY_CARE_PROVIDER_SITE_OTHER): Payer: Self-pay | Admitting: *Deleted

## 2013-04-04 MED ORDER — GABAPENTIN 300 MG PO CAPS: 300.0000 mg | ORAL_CAPSULE | Freq: Three times a day (TID) | ORAL | Status: DC

## 2013-04-04 NOTE — Telephone Encounter (Signed)
Prescription escribed to patient's pharmacy at this time.  Patient updated at this time.

## 2013-04-04 NOTE — Telephone Encounter (Signed)
Patient called to ask for a refill of her Gabapentin.  I do see from the 02/22/13 office note Dr. Derrell Lolling said he would be willing to refill but since it has been a little while just wanted to make sure and also to find out how many to refill with.  Explained to patient Dr. Derrell Lolling will be in clinic this afternoon so I should be able to find out then.  Patient states understanding and agreeable at this time.

## 2013-04-04 NOTE — Telephone Encounter (Signed)
Yes it is OK to refill her Rx. #90

## 2013-05-09 ENCOUNTER — Encounter (INDEPENDENT_AMBULATORY_CARE_PROVIDER_SITE_OTHER): Payer: Self-pay | Admitting: General Surgery

## 2013-05-09 ENCOUNTER — Ambulatory Visit (INDEPENDENT_AMBULATORY_CARE_PROVIDER_SITE_OTHER): Payer: 59 | Admitting: General Surgery

## 2013-05-09 ENCOUNTER — Other Ambulatory Visit (INDEPENDENT_AMBULATORY_CARE_PROVIDER_SITE_OTHER): Payer: Self-pay | Admitting: General Surgery

## 2013-05-09 VITALS — BP 124/72 | HR 92 | Temp 97.8°F | Ht 64.0 in | Wt 145.4 lb

## 2013-05-09 DIAGNOSIS — G8918 Other acute postprocedural pain: Secondary | ICD-10-CM

## 2013-05-09 MED ORDER — GABAPENTIN 300 MG PO CAPS: 300.0000 mg | ORAL_CAPSULE | Freq: Three times a day (TID) | ORAL | Status: DC

## 2013-05-09 NOTE — Progress Notes (Signed)
Patient ID: Kaitlyn Lozano, female   DOB: 12-Jan-1951, 62 y.o.   MRN: [id number] The patient is a 62 year old female status post bilateral spigelian hernia repair. 2 the patient several times postoperatively secondary to abdominal wall pain to the right likely consistent with nerve pain. The patient comes in today in with a chief complaint of right abdominal wall burning/stabbing pain. The patient has been on Neurontin 300 mg twice a day. She states that initially helped approximately 2 weeks and has waned off.     On exam: There is no hernia palpation.  Assessment and plan: 1. I proceeded with I nerve block with 10 cc of Marcaine, 0.25 percent 2. Will give her a refill prescription for Neurontin 30 mg 3 times a day. 3. We will have the patient follow back up in 2 weeks to assess for any further nerve pain.

## 2013-05-30 ENCOUNTER — Encounter (INDEPENDENT_AMBULATORY_CARE_PROVIDER_SITE_OTHER): Payer: Self-pay | Admitting: General Surgery

## 2013-05-30 ENCOUNTER — Ambulatory Visit (INDEPENDENT_AMBULATORY_CARE_PROVIDER_SITE_OTHER): Payer: 59 | Admitting: General Surgery

## 2013-05-30 VITALS — BP 110/64 | HR 68 | Temp 97.6°F | Resp 14 | Ht 64.0 in | Wt 145.6 lb

## 2013-05-30 DIAGNOSIS — G8918 Other acute postprocedural pain: Secondary | ICD-10-CM

## 2013-05-30 NOTE — Progress Notes (Signed)
Patient ID: Kaitlyn Lozano, female   DOB: June 17, 1951, 62 y.o.   MRN: [id number] The patient is a 62 year old female status post speglelianhernia repair x2/bilaterally.  Patient had chronic pain to her right hernia. She continue with Neurontin. After increasing in to 3 times a day she states she's slowly feeling better. She feels that the Marcaine injection unless this not help her pain.  Assessment and plan: 1. We'll continue with Neurontin at this time 3 times a day.  She has refills and will call us back if she needs more 2.she is currently 5 months out from surgery. We can refer to her chronic pain specialist should her pain continued. She will call us at the time she wants to be referred. 3. Patient to follow up as needed

## 2013-07-14 ENCOUNTER — Telehealth (INDEPENDENT_AMBULATORY_CARE_PROVIDER_SITE_OTHER): Payer: Self-pay | Admitting: *Deleted

## 2013-07-14 ENCOUNTER — Other Ambulatory Visit (INDEPENDENT_AMBULATORY_CARE_PROVIDER_SITE_OTHER): Payer: Self-pay | Admitting: *Deleted

## 2013-07-14 MED ORDER — GABAPENTIN 300 MG PO CAPS: 600.0000 mg | ORAL_CAPSULE | Freq: Two times a day (BID) | ORAL | Status: DC

## 2013-07-14 NOTE — Telephone Encounter (Signed)
Patient called to ask for a refill of her Gabapentin.  Patient states Dr. Derrell Lolling had told her she could take 600mg  BID so she is about to run out.  Spoke to Dr. Derrell Lolling in office who approved for 1 more month no refills.  Prescription escribed to patients pharmacy at this time.

## 2013-08-14 ENCOUNTER — Other Ambulatory Visit (INDEPENDENT_AMBULATORY_CARE_PROVIDER_SITE_OTHER): Payer: Self-pay

## 2013-08-14 DIAGNOSIS — M792 Neuralgia and neuritis, unspecified: Secondary | ICD-10-CM

## 2013-08-14 MED ORDER — GABAPENTIN 300 MG PO CAPS: 600.0000 mg | ORAL_CAPSULE | Freq: Two times a day (BID) | ORAL | Status: DC

## 2013-11-07 ENCOUNTER — Other Ambulatory Visit: Payer: Self-pay | Admitting: Family Medicine

## 2013-11-07 DIAGNOSIS — N6459 Other signs and symptoms in breast: Secondary | ICD-10-CM

## 2013-11-23 ENCOUNTER — Ambulatory Visit
Admission: RE | Admit: 2013-11-23 | Discharge: 2013-11-23 | Disposition: A | Discharge status: Home / Self Care | Payer: 59 | Source: Ambulatory Visit | Attending: Family Medicine | Admitting: Family Medicine

## 2013-11-23 ENCOUNTER — Encounter (INDEPENDENT_AMBULATORY_CARE_PROVIDER_SITE_OTHER): Payer: Self-pay

## 2013-11-23 DIAGNOSIS — N6459 Other signs and symptoms in breast: Secondary | ICD-10-CM

## 2014-07-04 ENCOUNTER — Emergency Department (INDEPENDENT_AMBULATORY_CARE_PROVIDER_SITE_OTHER)
Admission: EM | Admit: 2014-07-04 | Discharge: 2014-07-04 | Disposition: A | Discharge status: Home / Self Care | Payer: 59 | Source: Home / Self Care | Attending: Emergency Medicine | Admitting: Emergency Medicine

## 2014-07-04 ENCOUNTER — Encounter: Payer: Self-pay | Admitting: *Deleted

## 2014-07-04 DIAGNOSIS — J208 Acute bronchitis due to other specified organisms: Secondary | ICD-10-CM

## 2014-07-04 MED ORDER — HYDROCOD POLST-CHLORPHEN POLST 10-8 MG/5ML PO LQCR: 5.0000 mL | Freq: Two times a day (BID) | ORAL | Status: DC

## 2014-07-04 MED ORDER — AZITHROMYCIN 250 MG PO TABS: 250.0000 mg | ORAL_TABLET | Freq: Every day | ORAL | Status: DC

## 2014-07-04 MED ORDER — PREDNISONE 10 MG PO TABS: ORAL_TABLET | ORAL | Status: DC

## 2014-07-04 NOTE — ED Notes (Signed)
August c/o cough, productive at times x 3 weeks. Denies SOB or fever.

## 2014-07-04 NOTE — Discharge Instructions (Signed)

## 2014-07-05 NOTE — ED Provider Notes (Signed)
CSN: [id number]     Arrival date & time 07/04/14  1645 History   First MD Initiated Contact with Patient 07/04/14 1751     Chief Complaint  Patient presents with  . Cough     (Consider location/radiation/quality/duration/timing/severity/associated sxs/prior Treatment) Patient is a 63 y.o. female presenting with cough. The history is provided by the patient. No language interpreter was used.  Cough Severity:  Moderate Onset quality:  Gradual Duration:  3 weeks Timing:  Constant Progression:  Worsening Chronicity:  New Smoker: no   Context: upper respiratory infection   Relieved by:  Nothing Worsened by:  Nothing tried Ineffective treatments:  None tried Associated symptoms: sore throat   Risk factors: no recent infection     Past Medical History  Diagnosis Date  . Depression   . Depression   . Bronchitis     eosinophilic   Past Surgical History  Procedure Laterality Date  . Dnc    . Dilation and curettage of uterus  1993-1994  . Hernia repair     Family History  Problem Relation Age of Onset  . Atrial fibrillation Mother   . Transient ischemic attack Mother   . Hypertension Mother   . Stroke Mother    History  Substance Use Topics  . Smoking status: Never Smoker   . Smokeless tobacco: Never Used  . Alcohol Use: Yes     Comment: rare   OB History    No data available     Review of Systems  HENT: Positive for sore throat.   Respiratory: Positive for cough.   All other systems reviewed and are negative.     Allergies  Sulfonamide derivatives  Home Medications   Prior to Admission medications   Medication Sig Start Date End Date Taking? Authorizing Provider  azithromycin (ZITHROMAX) 250 MG tablet Take 1 tablet (250 mg total) by mouth daily. Take first 2 tablets together, then 1 every day until finished. 07/04/14   Fransico Meadow, PA-C  CALCIUM-VITAMIN D PO Take by mouth 2 (two) times daily.    Historical Provider, MD  chlorpheniramine-HYDROcodone  (TUSSIONEX PENNKINETIC ER) 10-8 MG/5ML LQCR Take 5 mLs by mouth 2 (two) times daily. 07/04/14   Fransico Meadow, PA-C  citalopram (CELEXA) 20 MG tablet  08/09/12   Historical Provider, MD  gabapentin (NEURONTIN) 300 MG capsule Take 2 capsules (600 mg total) by mouth 2 (two) times daily. 08/14/13   Ralene Ok, MD  Multiple Vitamins-Minerals (MULTIVITAMIN WITH MINERALS) tablet Take 1 tablet by mouth daily.    Historical Provider, MD  predniSONE (DELTASONE) 10 MG tablet 6,5,4,3,2,1 taper 07/04/14   Fransico Meadow, PA-C  Turmeric 500 MG CAPS Take 500 mg by mouth daily.    Historical Provider, MD  vitamin B-12 (CYANOCOBALAMIN) 500 MCG tablet Take 500 mcg by mouth daily.    Historical Provider, MD  vitamin C (ASCORBIC ACID) 500 MG tablet Take 500 mg by mouth daily.    Historical Provider, MD   BP 135/73 mmHg  Pulse 79  Temp(Src) 98.4 F (36.9 C) (Oral)  Resp 16  Wt 146 lb (66.225 kg)  SpO2 100% Physical Exam  Constitutional: She is oriented to person, place, and time. She appears well-developed and well-nourished.  HENT:  Head: Normocephalic and atraumatic.  Right Ear: External ear normal.  Eyes: Conjunctivae and EOM are normal. Pupils are equal, round, and reactive to light.  Neck: Normal range of motion.  Cardiovascular: Normal rate and normal heart sounds.   Pulmonary/Chest: Effort  normal and breath sounds normal.  Abdominal: Soft. She exhibits no distension.  Musculoskeletal: Normal range of motion.  Neurological: She is alert and oriented to person, place, and time.  Skin: Skin is warm.  Psychiatric: She has a normal mood and affect.  Nursing note and vitals reviewed.   ED Course  Procedures (including critical care time) Labs Review Labs Reviewed - No data to display  Imaging Review No results found.   EKG Interpretation None      MDM  I doubt pneumonia,  Afebrile and lungs clear.    Final diagnoses:  Acute bronchitis due to other specified organisms    Pt given rx  for tussionex, prednisone  and zithromax.   Pt advised to see her MD for recheck.   She is advised she may need a chest xray if symptoms persist past one week AVS    Fransico Meadow, PA-C 07/05/14 1100

## 2015-07-21 ENCOUNTER — Emergency Department (HOSPITAL_COMMUNITY): Payer: 59

## 2015-07-21 ENCOUNTER — Encounter (HOSPITAL_COMMUNITY): Payer: Self-pay | Admitting: Emergency Medicine

## 2015-07-21 ENCOUNTER — Inpatient Hospital Stay (HOSPITAL_COMMUNITY)
Admission: EM | Admit: 2015-07-21 | Discharge: 2015-07-24 | DRG: 247 | Disposition: A | Discharge status: Home / Self Care | Payer: 59 | Attending: Internal Medicine | Admitting: Internal Medicine

## 2015-07-21 DIAGNOSIS — I214 Non-ST elevation (NSTEMI) myocardial infarction: Principal | ICD-10-CM | POA: Diagnosis present

## 2015-07-21 DIAGNOSIS — I97630 Postprocedural hematoma of a circulatory system organ or structure following a cardiac catheterization: Secondary | ICD-10-CM

## 2015-07-21 DIAGNOSIS — Z955 Presence of coronary angioplasty implant and graft: Secondary | ICD-10-CM

## 2015-07-21 DIAGNOSIS — Y840 Cardiac catheterization as the cause of abnormal reaction of the patient, or of later complication, without mention of misadventure at the time of the procedure: Secondary | ICD-10-CM | POA: Diagnosis not present

## 2015-07-21 DIAGNOSIS — Z882 Allergy status to sulfonamides status: Secondary | ICD-10-CM

## 2015-07-21 DIAGNOSIS — I97410 Intraoperative hemorrhage and hematoma of a circulatory system organ or structure complicating a cardiac catheterization: Secondary | ICD-10-CM | POA: Diagnosis not present

## 2015-07-21 DIAGNOSIS — Z8249 Family history of ischemic heart disease and other diseases of the circulatory system: Secondary | ICD-10-CM

## 2015-07-21 DIAGNOSIS — R9431 Abnormal electrocardiogram [ECG] [EKG]: Secondary | ICD-10-CM | POA: Diagnosis present

## 2015-07-21 DIAGNOSIS — F32A Depression, unspecified: Secondary | ICD-10-CM | POA: Diagnosis present

## 2015-07-21 DIAGNOSIS — Z006 Encounter for examination for normal comparison and control in clinical research program: Secondary | ICD-10-CM

## 2015-07-21 DIAGNOSIS — I249 Acute ischemic heart disease, unspecified: Secondary | ICD-10-CM | POA: Diagnosis present

## 2015-07-21 DIAGNOSIS — F329 Major depressive disorder, single episode, unspecified: Secondary | ICD-10-CM | POA: Diagnosis present

## 2015-07-21 DIAGNOSIS — I2510 Atherosclerotic heart disease of native coronary artery without angina pectoris: Secondary | ICD-10-CM

## 2015-07-21 DIAGNOSIS — R0789 Other chest pain: Secondary | ICD-10-CM | POA: Diagnosis not present

## 2015-07-21 DIAGNOSIS — R079 Chest pain, unspecified: Secondary | ICD-10-CM | POA: Diagnosis present

## 2015-07-21 DIAGNOSIS — R0602 Shortness of breath: Secondary | ICD-10-CM | POA: Diagnosis present

## 2015-07-21 DIAGNOSIS — I25110 Atherosclerotic heart disease of native coronary artery with unstable angina pectoris: Secondary | ICD-10-CM | POA: Diagnosis present

## 2015-07-21 HISTORY — DX: Gastro-esophageal reflux disease without esophagitis: K21.9

## 2015-07-21 HISTORY — DX: Atherosclerotic heart disease of native coronary artery without angina pectoris: I25.10

## 2015-07-21 HISTORY — DX: Unspecified osteoarthritis, unspecified site: M19.90

## 2015-07-21 LAB — I-STAT TROPONIN, ED
Troponin i, poc: 0.01 ng/mL (ref 0.00–0.08)
Troponin i, poc: 0.01 ng/mL (ref 0.00–0.08)

## 2015-07-21 LAB — BASIC METABOLIC PANEL
Anion gap: 10 (ref 5–15)
BUN: 15 mg/dL (ref 6–20)
CALCIUM: 9.4 mg/dL (ref 8.9–10.3)
CHLORIDE: 102 mmol/L (ref 101–111)
CO2: 26 mmol/L (ref 22–32)
CREATININE: 0.87 mg/dL (ref 0.44–1.00)
GFR calc non Af Amer: >60 mL/min (ref >60)
GLUCOSE: 86 mg/dL (ref 65–99)
Potassium: 4.5 mmol/L (ref 3.5–5.1)
Sodium: 138 mmol/L (ref 135–145)

## 2015-07-21 LAB — CBC
HCT: 43.6 % (ref 36.0–46.0)
Hemoglobin: 15.5 g/dL — ABNORMAL HIGH (ref 12.0–15.0)
MCH: 33.3 pg (ref 26.0–34.0)
MCHC: 35.6 g/dL (ref 30.0–36.0)
MCV: 93.8 fL (ref 78.0–100.0)
PLATELETS: 345 10*3/uL (ref 150–400)
RBC: 4.65 MIL/uL (ref 3.87–5.11)
RDW: 14.8 % (ref 11.5–15.5)
WBC: 13.9 10*3/uL — ABNORMAL HIGH (ref 4.0–10.5)

## 2015-07-21 LAB — TROPONIN I: Troponin I: 0.09 ng/mL — ABNORMAL HIGH (ref <0.031)

## 2015-07-21 LAB — D-DIMER, QUANTITATIVE: D-Dimer, Quant: 0.33 ug{FEU}/mL (ref 0.00–0.50)

## 2015-07-21 MED ORDER — MORPHINE SULFATE (PF) 4 MG/ML IV SOLN
4.0000 mg | Freq: Once | INTRAVENOUS | Status: DC
  Filled 2015-07-21: qty 1

## 2015-07-21 MED ORDER — NITROGLYCERIN 0.4 MG/HR TD PT24: 0.4000 mg | MEDICATED_PATCH | Freq: Once | TRANSDERMAL | Status: DC | PRN

## 2015-07-21 MED ORDER — ONDANSETRON HCL 4 MG/2ML IJ SOLN: 4.0000 mg | Freq: Four times a day (QID) | INTRAMUSCULAR | Status: DC | PRN

## 2015-07-21 MED ORDER — TURMERIC 500 MG PO CAPS: 500.0000 mg | ORAL_CAPSULE | Freq: Every day | ORAL | Status: DC

## 2015-07-21 MED ORDER — NITROGLYCERIN 2 % TD OINT
1.0000 [in_us] | TOPICAL_OINTMENT | Freq: Once | TRANSDERMAL | Status: AC
  Administered 2015-07-22: 1 [in_us] via TOPICAL
  Filled 2015-07-21: qty 30

## 2015-07-21 MED ORDER — GI COCKTAIL ~~LOC~~
30.0000 mL | Freq: Four times a day (QID) | ORAL | Status: DC | PRN
Start: 2015-07-21 — End: 2015-07-24
  Filled 2015-07-21: qty 30

## 2015-07-21 MED ORDER — MORPHINE SULFATE (PF) 2 MG/ML IV SOLN: 2.0000 mg | INTRAVENOUS | Status: DC | PRN

## 2015-07-21 MED ORDER — NITROGLYCERIN 0.4 MG SL SUBL
0.4000 mg | SUBLINGUAL_TABLET | SUBLINGUAL | Status: DC | PRN
  Administered 2015-07-21: 0.4 mg via SUBLINGUAL
  Filled 2015-07-21: qty 1

## 2015-07-21 MED ORDER — ASPIRIN 81 MG PO CHEW
324.0000 mg | CHEWABLE_TABLET | Freq: Once | ORAL | Status: AC
  Administered 2015-07-21: 324 mg via ORAL
  Filled 2015-07-21: qty 4

## 2015-07-21 MED ORDER — HEPARIN SODIUM (PORCINE) 5000 UNIT/ML IJ SOLN
5000.0000 [IU] | Freq: Three times a day (TID) | INTRAMUSCULAR | Status: DC
  Administered 2015-07-21: 5000 [IU] via SUBCUTANEOUS
  Filled 2015-07-21: qty 1

## 2015-07-21 MED ORDER — VITAMIN B-12 1000 MCG PO TABS
1000.0000 ug | ORAL_TABLET | Freq: Every day | ORAL | Status: DC
  Administered 2015-07-22 – 2015-07-24 (×2): 1000 ug via ORAL
  Filled 2015-07-21 (×2): qty 1

## 2015-07-21 MED ORDER — ACETAMINOPHEN 325 MG PO TABS
650.0000 mg | ORAL_TABLET | ORAL | Status: DC | PRN
  Administered 2015-07-22: 650 mg via ORAL
  Filled 2015-07-21: qty 2

## 2015-07-21 MED ORDER — VITAMIN C 500 MG PO TABS
500.0000 mg | ORAL_TABLET | Freq: Every day | ORAL | Status: DC
  Administered 2015-07-22 – 2015-07-24 (×3): 500 mg via ORAL
  Filled 2015-07-21 (×3): qty 1

## 2015-07-21 MED ORDER — VITAMIN D 1000 UNITS PO TABS
1000.0000 [IU] | ORAL_TABLET | Freq: Every day | ORAL | Status: DC
Start: 2015-07-22 — End: 2015-07-24
  Administered 2015-07-22 – 2015-07-24 (×3): 1000 [IU] via ORAL
  Filled 2015-07-21 (×3): qty 1

## 2015-07-21 MED ORDER — NITROGLYCERIN 0.4 MG SL SUBL
0.4000 mg | SUBLINGUAL_TABLET | SUBLINGUAL | Status: DC | PRN
  Administered 2015-07-21 – 2015-07-23 (×2): 0.4 mg via SUBLINGUAL
  Filled 2015-07-21: qty 1

## 2015-07-21 MED ORDER — NITROGLYCERIN 2 % TD OINT
1.0000 [in_us] | TOPICAL_OINTMENT | Freq: Once | TRANSDERMAL | Status: DC
  Filled 2015-07-21 (×2): qty 1

## 2015-07-21 MED ORDER — HEPARIN (PORCINE) IN NACL 100-0.45 UNIT/ML-% IJ SOLN
700.0000 [IU]/h | INTRAMUSCULAR | Status: DC
  Administered 2015-07-22: 750 [IU]/h via INTRAVENOUS
  Filled 2015-07-21: qty 250

## 2015-07-21 MED ORDER — CITALOPRAM HYDROBROMIDE 20 MG PO TABS
20.0000 mg | ORAL_TABLET | Freq: Every day | ORAL | Status: DC
  Administered 2015-07-22 – 2015-07-24 (×3): 20 mg via ORAL
  Filled 2015-07-21 (×3): qty 1

## 2015-07-21 MED ORDER — ADULT MULTIVITAMIN W/MINERALS CH
1.0000 | ORAL_TABLET | Freq: Every day | ORAL | Status: DC
  Filled 2015-07-21: qty 1

## 2015-07-21 NOTE — Progress Notes (Signed)
ANTICOAGULATION CONSULT NOTE - Initial Consult  Pharmacy Consult for heparin Indication: chest pain/ACS  Allergies  Allergen Reactions  . Sulfa Antibiotics Rash  . Sulfonamide Derivatives Rash    Patient Measurements: Height: 5\' 4"  (162.6 cm) Weight: 140 lb (63.504 kg) IBW/kg (Calculated) : 54.7  Vital Signs: Temp: 97.9 F (36.6 C) (12/18 2203) Temp Source: Oral (12/18 2203) BP: 111/59 mmHg (12/18 2203) Pulse Rate: 92 (12/18 2203)  Labs:  Recent Labs  07/21/15 1228 07/21/15 2142  HGB 15.5*  --   HCT 43.6  --   PLT 345  --   CREATININE 0.87  --   TROPONINI  --  0.09*    Estimated Creatinine Clearance: 55.7 mL/min (by C-G formula based on Cr of 0.87).   Medical History: Past Medical History  Diagnosis Date  . Depression   . Depression   . Bronchitis     eosinophilic    Medications:  Prescriptions prior to admission  Medication Sig Dispense Refill Last Dose  . acetaminophen (TYLENOL) 650 MG CR tablet Take 1,300 mg by mouth at bedtime as needed for pain.   1-2 weeks ago  . Calcium Citrate-Vitamin D (CITRACAL + D PO) Take 1 tablet by mouth 2 (two) times daily.   07/21/2015 at am  . Cholecalciferol (VITAMIN D PO) Take 1 tablet by mouth daily.   07/21/2015 at Unknown time  . citalopram (CELEXA) 20 MG tablet Take 20 mg by mouth daily.    07/21/2015 at Unknown time  . Multiple Vitamins-Minerals (MULTIVITAMIN WITH MINERALS) tablet Take 1 tablet by mouth daily. Centrum Silver   07/21/2015 at Unknown time  . Turmeric 500 MG CAPS Take 500 mg by mouth daily.   07/21/2015 at Unknown time  . vitamin B-12 (CYANOCOBALAMIN) 1000 MCG tablet Take 1,000 mcg by mouth daily.   07/21/2015 at Unknown time  . vitamin C (ASCORBIC ACID) 500 MG tablet Take 500 mg by mouth daily.   07/21/2015 at Unknown time   Scheduled:  . [START ON 07/22/2015] cholecalciferol  1,000 Units Oral Daily  . [START ON 07/22/2015] citalopram  20 mg Oral Daily  . heparin  5,000 Units Subcutaneous 3 times  per day  .  morphine injection  4 mg Intravenous Once  . [START ON 07/22/2015] multivitamin with minerals  1 tablet Oral Daily  . nitroGLYCERIN  1 inch Topical Once  . [START ON 07/22/2015] vitamin B-12  1,000 mcg Oral Daily  . [START ON 07/22/2015] vitamin C  500 mg Oral Daily    Assessment: 64yo female c/o exertional intermittent CP radiating to throat and associated w/ SOB x1wk, today had sudden onset of chest tightness/pressure w/ SOB, diaphoresis, and nausea, i-stat troponin negative but now trending up, to begin heparin.  Goal of Therapy:  Heparin level 0.3-0.7 units/ml Monitor platelets by anticoagulation protocol: Yes   Plan:  Rec'd SQ heparin ~2hr ago; will continue with heparin gtt at 750 units/hr and monitor heparin levels and CBC.   Wynona Neat, PharmD, BCPS  07/21/2015,11:44 PM

## 2015-07-21 NOTE — ED Notes (Signed)
Hospitalist bedside 

## 2015-07-21 NOTE — ED Notes (Signed)
The pt is primarily discusted that she has been here so long,  She came in with chest tightness  And dizziness the pain started while she was at church this am.  She has had this for one week intermittently.  Alert oriented skin warm and dry,  Pain is better,  No cardiac history.

## 2015-07-21 NOTE — H&P (Addendum)
[facility] History and Physical  Kaitlyn Lozano [id number] DOB: 04-09-1951 DOA: 07/21/2015  Referring physician: ED PCP: Reginia Naas, MD   Chief Complaint: Chest pain HPI:  Ms. Kaitlyn Lozano is a 64 year old female with a past medical history significant for depression; presents with complaints of chest pain. Symptoms have been off and on over the last week. Symptoms today started around noon while sitting at church. She stated acute onset of substernal chest pain/pressure which radiated up to her throat. Pain was noted to be 10 out of 10 at the time of onset. She had associated symptoms of shortness of breath nausea. She was brought immediately to the nurse department by her family she was given nitroglycerin which helped relieve the pain. Prior in the week she noted 2 episodes of acute sharp substernal chest pain after lunch following walking up 2 flights of stairs. Symptoms lasted approximately a few minutes and then self resolved. She's episode she also felt some shortness of breath. She notes symptoms are different than GERD symptoms in that she normally just felt a burning sensation in her throat. She reports never having symptoms like this in the past and reports that both her mother and father have heart issues but most symptoms started after the age of 67. Patient denies any vomiting or any recent medication changes.    Review of Systems  Constitutional: Negative for fever and chills.  HENT: Negative for ear discharge and ear pain.   Eyes: Negative for double vision and photophobia.  Respiratory: Positive for shortness of breath.   Cardiovascular: Positive for chest pain and leg swelling.  Gastrointestinal: Positive for nausea. Negative for vomiting and abdominal pain.  Genitourinary: Negative for urgency and frequency.  Musculoskeletal: Negative for falls and neck pain.  Skin: Negative for itching and rash.  Neurological: Negative for sensory change and speech change.   Endo/Heme/Allergies: Negative for polydipsia. Does not bruise/bleed easily.  Psychiatric/Behavioral: Negative for suicidal ideas and substance abuse.        Past Medical History  Diagnosis Date  . Depression   . Depression   . Bronchitis     eosinophilic     Past Surgical History  Procedure Laterality Date  . Dnc    . Dilation and curettage of uterus  1993-1994  . Hernia repair        Social History:  reports that she has never smoked. She has never used smokeless tobacco. She reports that she drinks alcohol. She reports that she does not use illicit drugs. Where does patient live--home with whom if at home? husband Can patient participate in ADLs? Yes  Allergies  Allergen Reactions  . Sulfa Antibiotics Rash  . Sulfonamide Derivatives Rash    Family History  Problem Relation Age of Onset  . Atrial fibrillation Mother   . Transient ischemic attack Mother   . Hypertension Mother   . Stroke Mother       FAMILY HISTORY  When questioned  Directly-patient reports  No family history of HTN, CVA ,DIABETES, TB, Cancer CAD, Bleeding Disorders, Sickle Cell, diabetes, anemia, asthma,   Prior to Admission medications   Medication Sig Start Date End Date Taking? Authorizing Provider  acetaminophen (TYLENOL) 650 MG CR tablet Take 1,300 mg by mouth at bedtime as needed for pain.   Yes Historical Provider, MD  Calcium Citrate-Vitamin D (CITRACAL + D PO) Take 1 tablet by mouth 2 (two) times daily.   Yes Historical Provider, MD  Cholecalciferol (VITAMIN D PO) Take 1 tablet by  mouth daily.   Yes Historical Provider, MD  citalopram (CELEXA) 20 MG tablet Take 20 mg by mouth daily.  08/09/12  Yes Historical Provider, MD  Multiple Vitamins-Minerals (MULTIVITAMIN WITH MINERALS) tablet Take 1 tablet by mouth daily. Centrum Silver   Yes Historical Provider, MD  Turmeric 500 MG CAPS Take 500 mg by mouth daily.   Yes Historical Provider, MD  vitamin B-12 (CYANOCOBALAMIN) 1000 MCG tablet  Take 1,000 mcg by mouth daily.   Yes Historical Provider, MD  vitamin C (ASCORBIC ACID) 500 MG tablet Take 500 mg by mouth daily.   Yes Historical Provider, MD     Physical Exam: Filed Vitals:   07/21/15 1830 07/21/15 1900 07/21/15 1930 07/21/15 2000  BP: 145/69 158/136 139/65 126/58  Pulse: 75 77 73 87  Temp:      TempSrc:      Resp: 14 13 12 10   Height:      Weight:      SpO2: 98% 100% 100% 98%     Constitutional: Vital signs reviewed. Patient is a well-developed and well-nourished in no acute distress and cooperative with exam. Alert and oriented x3.  Head: Normocephalic and atraumatic  Ear: TM normal bilaterally  Mouth: no erythema or exudates, MMM  Eyes: PERRL, EOMI, conjunctivae normal, No scleral icterus.  Neck: Supple, Trachea midline normal ROM, No JVD, mass, thyromegaly, or carotid bruit present.  Cardiovascular: RRR, S1 normal, S2 normal, no MRG, pulses symmetric and intact bilaterally  Pulmonary/Chest: CTAB, no wheezes, rales, or rhonchi  Abdominal: Soft. Non-tender, non-distended, bowel sounds are normal, no masses, organomegaly, or guarding present.  GU: no CVA tenderness Musculoskeletal: No joint deformities, erythema, or stiffness, ROM full and no nontender Ext: trace pitting edema bilateral lower extremities and no cyanosis, pulses palpable bilaterally (DP and PT)  Hematology: no cervical, inginal, or axillary adenopathy.  Neurological: A&O x3, Strenght is normal and symmetric bilaterally, cranial nerve II-XII are grossly intact, no focal motor deficit, sensory intact to light touch bilaterally.  Skin: Warm, dry and intact. No rash, cyanosis, or clubbing.  Psychiatric: Normal mood and affect. speech and behavior is normal. Judgment and thought content normal. Cognition and memory are normal.      Data Review   Micro Results No results found for this or any previous visit (from the past 240 hour(s)).  Radiology Reports Dg Chest 2 View  07/21/2015   CLINICAL DATA:  Chest pain radiating into the throat. EXAM: CHEST  2 VIEW COMPARISON:  None. FINDINGS: Normal heart size. Lungs are hyperaerated and clear. No pneumothorax or pleural effusion. IMPRESSION: No active cardiopulmonary disease. Electronically Signed   By: Marybelle Killings M.D.   On: 07/21/2015 13:16     CBC  Recent Labs Lab 07/21/15 1228  WBC 13.9*  HGB 15.5*  HCT 43.6  PLT 345  MCV 93.8  MCH 33.3  MCHC 35.6  RDW 14.8    Chemistries   Recent Labs Lab 07/21/15 1228  NA 138  K 4.5  CL 102  CO2 26  GLUCOSE 86  BUN 15  CREATININE 0.87  CALCIUM 9.4   ------------------------------------------------------------------------------------------------------------------ estimated creatinine clearance is 55.7 mL/min (by C-G formula based on Cr of 0.87). ------------------------------------------------------------------------------------------------------------------ No results for input(s): HGBA1C in the last 72 hours. ------------------------------------------------------------------------------------------------------------------ No results for input(s): CHOL, HDL, LDLCALC, TRIG, CHOLHDL, LDLDIRECT in the last 72 hours. ------------------------------------------------------------------------------------------------------------------ No results for input(s): TSH, T4TOTAL, T3FREE, THYROIDAB in the last 72 hours.  Invalid input(s): FREET3 ------------------------------------------------------------------------------------------------------------------ No results for input(s): VITAMINB12, FOLATE, FERRITIN,  TIBC, IRON, RETICCTPCT in the last 72 hours.  Coagulation profile No results for input(s): INR, PROTIME in the last 168 hours.   Recent Labs  07/21/15 1730  DDIMER 0.33    Cardiac Enzymes No results for input(s): CKMB, TROPONINI, MYOGLOBIN in the last 168 hours.  Invalid input(s):  CK ------------------------------------------------------------------------------------------------------------------ Invalid input(s): POCBNP   CBG: No results for input(s): GLUCAP in the last 168 hours.     EKG: Independently reviewed. Normal sinus rhythm with signs of a possible left atrial abnormality   Assessment/Plan   Chest pain: acute. Patient with history is suspicious for the possibility of myocardial injury given chest pain and pressure with exertion and now rest. Heart score calculated to be 3 as she has no other family history or cardiac risk factors except for age. Initial EKG shows normal sinus rhythm and troponin 0.01. - admit to a telemetry bed - Patient NPO after midnight for possible procedure in a.m. - Continue to trend out troponins - Echocardiogram in a.m. - nitro paste if repeat chest pain occurs - consult placed to cardiology to evaluate patient in a.m. for need a stress test - morphine prn pain   Shortness of breath: intermittent with complaints of chest pain. O2 sats remained within normal limits. Chest x-ray showed no acute signs of infiltrate. - Nasal cannula oxygen for shortness of breath symptoms    Depression -continue Celexa   Code Status:   full Family Communication: bedside Disposition Plan: admit   Total time spent 55 minutes.Greater than 50% of this time was spent in counseling, explanation of diagnosis, planning of further management, and coordination of care  [facility] Pager [phone]  If 7PM-7AM, please contact night-coverage [email or web address] Password [facility] 07/21/2015, 8:38 PM

## 2015-07-21 NOTE — ED Notes (Signed)
The pt reports that just after the nitro she had some relief but now she is back and it is up into her throat

## 2015-07-21 NOTE — ED Notes (Signed)
Patient transported to X-ray 

## 2015-07-21 NOTE — ED Provider Notes (Signed)
CSN: [id number]     Arrival date & time 07/21/15  1221 History   First MD Initiated Contact with Patient 07/21/15 1511     Chief Complaint  Patient presents with  . Chest Pain     (Consider location/radiation/quality/duration/timing/severity/associated sxs/prior Treatment) HPI   Kaitlyn Lozano is a 64 y.o F with no significant pmhx who presents to the ED today c/o chest pain. Patient states that she has been having intermittent chest pain with exertion over the last week with associated shortness of breath. Today when she was in church she felt sudden onset chest tightness and pressure, shortness of breath, diaphoresis and nausea. Pain is located in the center of her chest and radiates up to her throat. This episode lasted for 1 minute. Pt feels that this may be associated with the prednisone she has been taking over the last week for a rotator cuff bursitis. Denies numbness or tingling, syncope, headache, lower extremity swelling, dizziness, fever, chills, cough. She came to the emergency department by personal vehicle. No tobacco use. No exogenous estrogen use.   Past Medical History  Diagnosis Date  . Depression   . Depression   . Bronchitis     eosinophilic   Past Surgical History  Procedure Laterality Date  . Dnc    . Dilation and curettage of uterus  1993-1994  . Hernia repair     Family History  Problem Relation Age of Onset  . Atrial fibrillation Mother   . Transient ischemic attack Mother   . Hypertension Mother   . Stroke Mother    Social History  Substance Use Topics  . Smoking status: Never Smoker   . Smokeless tobacco: Never Used  . Alcohol Use: Yes     Comment: rare   OB History    No data available     Review of Systems  All other systems reviewed and are negative.     Allergies  Sulfonamide derivatives  Home Medications   Prior to Admission medications   Medication Sig Start Date End Date Taking? Authorizing Provider  citalopram (CELEXA) 20 MG  tablet  08/09/12  Yes Historical Provider, MD  predniSONE (DELTASONE) 10 MG tablet 6,5,4,3,2,1 taper 07/04/14  Yes Hollace Kinnier Sofia, PA-C  azithromycin (ZITHROMAX) 250 MG tablet Take 1 tablet (250 mg total) by mouth daily. Take first 2 tablets together, then 1 every day until finished. 07/04/14   Fransico Meadow, PA-C  CALCIUM-VITAMIN D PO Take by mouth 2 (two) times daily.    Historical Provider, MD  chlorpheniramine-HYDROcodone (TUSSIONEX PENNKINETIC ER) 10-8 MG/5ML LQCR Take 5 mLs by mouth 2 (two) times daily. 07/04/14   Fransico Meadow, PA-C  gabapentin (NEURONTIN) 300 MG capsule Take 2 capsules (600 mg total) by mouth 2 (two) times daily. 08/14/13   Ralene Ok, MD  Multiple Vitamins-Minerals (MULTIVITAMIN WITH MINERALS) tablet Take 1 tablet by mouth daily.    Historical Provider, MD  Turmeric 500 MG CAPS Take 500 mg by mouth daily.    Historical Provider, MD  vitamin B-12 (CYANOCOBALAMIN) 500 MCG tablet Take 500 mcg by mouth daily.    Historical Provider, MD  vitamin C (ASCORBIC ACID) 500 MG tablet Take 500 mg by mouth daily.    Historical Provider, MD   BP 135/63 mmHg  Pulse 100  Temp(Src) 98.6 F (37 C) (Oral)  Resp 22  Ht 5\' 4"  (1.626 m)  Wt 63.504 kg  BMI 24.02 kg/m2  SpO2 100% Physical Exam  Constitutional: She is oriented to person,  place, and time. She appears well-developed and well-nourished. No distress.  HENT:  Head: Normocephalic and atraumatic.  Mouth/Throat: Oropharynx is clear and moist. No oropharyngeal exudate.  Eyes: Conjunctivae and EOM are normal. Pupils are equal, round, and reactive to light. Right eye exhibits no discharge. Left eye exhibits no discharge. No scleral icterus.  Neck: Neck supple. No JVD present.  Cardiovascular: Normal rate, regular rhythm, normal heart sounds and intact distal pulses.  Exam reveals no gallop and no friction rub.   No murmur heard. Pulmonary/Chest: Effort normal and breath sounds normal. No respiratory distress. She has no wheezes.  She has no rales. She exhibits no tenderness.  Abdominal: Soft. She exhibits no distension. There is no tenderness. There is no guarding.  Musculoskeletal: Normal range of motion. She exhibits no edema.  Lymphadenopathy:    She has no cervical adenopathy.  Neurological: She is alert and oriented to person, place, and time. No cranial nerve deficit. She exhibits normal muscle tone.  Strength 5/5 throughout. No sensory deficits.  No gait abnormality.   Skin: Skin is warm and dry. No rash noted. She is not diaphoretic. No erythema. No pallor.  Psychiatric: She has a normal mood and affect. Her behavior is normal.  Nursing note and vitals reviewed.   ED Course  Procedures (including critical care time) Labs Review Labs Reviewed  CBC - Abnormal; Notable for the following:    WBC 13.9 (*)    Hemoglobin 15.5 (*)    All other components within normal limits  TROPONIN I - Abnormal; Notable for the following:    Troponin I 0.09 (*)    All other components within normal limits  BASIC METABOLIC PANEL  D-DIMER, QUANTITATIVE (NOT AT [facility])  TROPONIN I  TROPONIN I  I-STAT TROPOININ, ED  Randolm Idol, ED    Imaging Review Dg Chest 2 View  07/21/2015  CLINICAL DATA:  Chest pain radiating into the throat. EXAM: CHEST  2 VIEW COMPARISON:  None. FINDINGS: Normal heart size. Lungs are hyperaerated and clear. No pneumothorax or pleural effusion. IMPRESSION: No active cardiopulmonary disease. Electronically Signed   By: Marybelle Killings M.D.   On: 07/21/2015 13:16   I have personally reviewed and evaluated these images and lab results as part of my medical decision-making.   EKG Interpretation   Date/Time:  Sunday July 21 2015 12:27:46 EST Ventricular Rate:  96 PR Interval:  112 QRS Duration: 80 QT Interval:  326 QTC Calculation: 411 R Axis:   72 Text Interpretation:  Normal sinus rhythm Possible Left atrial enlargement  Nonspecific ST and T wave abnormality Nonspecific TW abnormality  in  anterior and inferior leads new from prior ECG in 2001 Confirmed by  [facility] MD, ERIN ([id number]) on 07/21/2015 3:14:16 PM      MDM   Final diagnoses:  Chest pain, unspecified chest pain type    Otherwise healthy 64 y.o presents for worsening exertional chest pain and episodic chest pain at rest with associated diaphoresis and shortness of breath that occurred today. Patient appears well in ED, nontoxic. Vital signs stable. No cardiac risk factors. However, Given patient's concerning symptoms and her age her heart score is a 5. Concern for ACS vs PE. EKG with nonspecific changes. Initial troponin normal. Leukocytosis present, suspect this is due to steroid use. Chest x-ray unremarkable.  Patient given aspirin and nitroglycerin in the ED. NG relieved Cp symptoms.  D-dimer normal, doubt PE. No hypoxia, no tachycardia.  Spoke with cardiology who recommends inpatient assessment for chest  pain rule out. They will consult pt on the floor.  Spoke with hospitalist who will admit pt to their service. Pt is hemodynamically stable.  Patient was discussed with and seen by Dr. Billy Fischer who agrees with the treatment plan.         Dondra Spry Max, PA-C 07/21/15 [id number]  Gareth Morgan, MD 07/25/15 1452

## 2015-07-21 NOTE — ED Notes (Signed)
Report called to rn on 2w 

## 2015-07-21 NOTE — ED Notes (Signed)
The pt reports that her pain has increased.  She reports that she has difficulty breathing.  sats 100%  Nasal 02 applied for reassurance   2 liters.  Iv placed saline lok

## 2015-07-21 NOTE — ED Notes (Signed)
Pt c/o center chest pain that radiates up to throat x 1 week. Pt thinks its coming from the prednisone that she is taking.

## 2015-07-22 ENCOUNTER — Observation Stay (HOSPITAL_BASED_OUTPATIENT_CLINIC_OR_DEPARTMENT_OTHER): Payer: 59

## 2015-07-22 ENCOUNTER — Encounter (HOSPITAL_COMMUNITY): Payer: Self-pay | Admitting: Student

## 2015-07-22 DIAGNOSIS — F32A Depression, unspecified: Secondary | ICD-10-CM | POA: Diagnosis present

## 2015-07-22 DIAGNOSIS — R011 Cardiac murmur, unspecified: Secondary | ICD-10-CM | POA: Diagnosis not present

## 2015-07-22 DIAGNOSIS — I214 Non-ST elevation (NSTEMI) myocardial infarction: Principal | ICD-10-CM

## 2015-07-22 DIAGNOSIS — I209 Angina pectoris, unspecified: Secondary | ICD-10-CM | POA: Diagnosis not present

## 2015-07-22 DIAGNOSIS — I249 Acute ischemic heart disease, unspecified: Secondary | ICD-10-CM | POA: Diagnosis not present

## 2015-07-22 DIAGNOSIS — R0602 Shortness of breath: Secondary | ICD-10-CM | POA: Diagnosis present

## 2015-07-22 DIAGNOSIS — Z8249 Family history of ischemic heart disease and other diseases of the circulatory system: Secondary | ICD-10-CM

## 2015-07-22 DIAGNOSIS — I25110 Atherosclerotic heart disease of native coronary artery with unstable angina pectoris: Secondary | ICD-10-CM | POA: Diagnosis not present

## 2015-07-22 DIAGNOSIS — F329 Major depressive disorder, single episode, unspecified: Secondary | ICD-10-CM | POA: Diagnosis present

## 2015-07-22 DIAGNOSIS — R079 Chest pain, unspecified: Secondary | ICD-10-CM | POA: Diagnosis not present

## 2015-07-22 LAB — LIPID PANEL
Cholesterol: 152 mg/dL (ref 0–200)
HDL: 61 mg/dL (ref >40)
LDL CALC: 69 mg/dL (ref 0–99)
Total CHOL/HDL Ratio: 2.5 RATIO
Triglycerides: 112 mg/dL (ref <150)
VLDL: 22 mg/dL (ref 0–40)

## 2015-07-22 LAB — TROPONIN I
TROPONIN I: 0.1 ng/mL — AB (ref <0.031)
Troponin I: 0.09 ng/mL — ABNORMAL HIGH (ref <0.031)

## 2015-07-22 LAB — HEPARIN LEVEL (UNFRACTIONATED)
HEPARIN UNFRACTIONATED: 0.51 [IU]/mL (ref 0.30–0.70)
Heparin Unfractionated: 0.7 IU/mL (ref 0.30–0.70)
Heparin Unfractionated: 0.16 IU/mL — ABNORMAL LOW (ref 0.30–0.70)

## 2015-07-22 LAB — CBC
HEMATOCRIT: 38.6 % (ref 36.0–46.0)
Hemoglobin: 13.7 g/dL (ref 12.0–15.0)
MCH: 33.4 pg (ref 26.0–34.0)
MCHC: 35.5 g/dL (ref 30.0–36.0)
MCV: 94.1 fL (ref 78.0–100.0)
PLATELETS: 264 10*3/uL (ref 150–400)
RBC: 4.1 MIL/uL (ref 3.87–5.11)
RDW: 14.9 % (ref 11.5–15.5)
WBC: 10.1 10*3/uL (ref 4.0–10.5)

## 2015-07-22 MED ORDER — METOPROLOL TARTRATE 12.5 MG HALF TABLET
12.5000 mg | ORAL_TABLET | Freq: Two times a day (BID) | ORAL | Status: DC
  Administered 2015-07-22 – 2015-07-24 (×6): 12.5 mg via ORAL
  Filled 2015-07-22 (×6): qty 1

## 2015-07-22 MED ORDER — SODIUM CHLORIDE 0.9 % IJ SOLN: 3.0000 mL | INTRAMUSCULAR | Status: DC | PRN

## 2015-07-22 MED ORDER — NITROGLYCERIN 2 % TD OINT
1.0000 [in_us] | TOPICAL_OINTMENT | Freq: Once | TRANSDERMAL | Status: DC
  Filled 2015-07-22: qty 30

## 2015-07-22 MED ORDER — SODIUM CHLORIDE 0.9 % IV SOLN: 250.0000 mL | INTRAVENOUS | Status: DC | PRN

## 2015-07-22 MED ORDER — NITROGLYCERIN IN D5W 200-5 MCG/ML-% IV SOLN
0.0000 ug/min | INTRAVENOUS | Status: DC
  Administered 2015-07-22: 5 ug/min via INTRAVENOUS
  Filled 2015-07-22: qty 250

## 2015-07-22 MED ORDER — ASPIRIN 81 MG PO CHEW
81.0000 mg | CHEWABLE_TABLET | ORAL | Status: AC
  Administered 2015-07-23: 81 mg via ORAL
  Filled 2015-07-22: qty 1

## 2015-07-22 MED ORDER — ADULT MULTIVITAMIN W/MINERALS CH
1.0000 | ORAL_TABLET | Freq: Every day | ORAL | Status: DC
  Administered 2015-07-22 – 2015-07-24 (×3): 1 via ORAL
  Filled 2015-07-22 (×3): qty 1

## 2015-07-22 MED ORDER — ASPIRIN 81 MG PO CHEW
81.0000 mg | CHEWABLE_TABLET | Freq: Every day | ORAL | Status: DC
  Administered 2015-07-22 – 2015-07-24 (×2): 81 mg via ORAL
  Filled 2015-07-22 (×2): qty 1

## 2015-07-22 MED ORDER — SODIUM CHLORIDE 0.9 % WEIGHT BASED INFUSION
3.0000 mL/kg/h | INTRAVENOUS | Status: DC
  Administered 2015-07-23: 15.732 mL/kg/h via INTRAVENOUS
  Administered 2015-07-23: 3 mL/kg/h via INTRAVENOUS

## 2015-07-22 MED ORDER — SODIUM CHLORIDE 0.9 % WEIGHT BASED INFUSION: 1.0000 mL/kg/h | INTRAVENOUS | Status: DC

## 2015-07-22 MED ORDER — SODIUM CHLORIDE 0.9 % IJ SOLN: 3.0000 mL | Freq: Two times a day (BID) | INTRAMUSCULAR | Status: DC

## 2015-07-22 MED ORDER — METOPROLOL TARTRATE 12.5 MG HALF TABLET
12.5000 mg | ORAL_TABLET | Freq: Once | ORAL | Status: AC
  Administered 2015-07-22: 12.5 mg via ORAL
  Filled 2015-07-22: qty 1

## 2015-07-22 MED ORDER — SIMVASTATIN 20 MG PO TABS: 40.0000 mg | ORAL_TABLET | Freq: Every day | ORAL | Status: DC

## 2015-07-22 NOTE — Progress Notes (Signed)
[facility] for heparin Indication: chest pain/ACS  Allergies  Allergen Reactions  . Sulfa Antibiotics Rash  . Sulfonamide Derivatives Rash    Patient Measurements: Height: 5\' 4"  (162.6 cm) Weight: 140 lb (63.504 kg) IBW/kg (Calculated) : 54.7  Vital Signs: Temp: 97.9 F (36.6 C) (12/19 0423) Temp Source: Oral (12/19 0423) BP: 112/55 mmHg (12/19 0835) Pulse Rate: 90 (12/19 0835)  Labs:  Recent Labs  07/21/15 1228 07/21/15 2142 07/22/15 0025 07/22/15 0304 07/22/15 0840  HGB 15.5*  --   --   --  13.7  HCT 43.6  --   --   --  38.6  PLT 345  --   --   --  264  CREATININE 0.87  --   --   --   --   TROPONINI  --  0.09* 0.10* 0.09*  --     Estimated Creatinine Clearance: 55.7 mL/min (by C-G formula based on Cr of 0.87).   Medical History: Past Medical History  Diagnosis Date  . Depression   . Depression   . Bronchitis     eosinophilic  . GERD (gastroesophageal reflux disease)   . Arthritis     OA    Medications:  Prescriptions prior to admission  Medication Sig Dispense Refill Last Dose  . acetaminophen (TYLENOL) 650 MG CR tablet Take 1,300 mg by mouth at bedtime as needed for pain.   1-2 weeks ago  . Calcium Citrate-Vitamin D (CITRACAL + D PO) Take 1 tablet by mouth 2 (two) times daily.   07/21/2015 at am  . Cholecalciferol (VITAMIN D PO) Take 1 tablet by mouth daily.   07/21/2015 at Unknown time  . citalopram (CELEXA) 20 MG tablet Take 20 mg by mouth daily.    07/21/2015 at Unknown time  . Multiple Vitamins-Minerals (MULTIVITAMIN WITH MINERALS) tablet Take 1 tablet by mouth daily. Centrum Silver   07/21/2015 at Unknown time  . Turmeric 500 MG CAPS Take 500 mg by mouth daily.   07/21/2015 at Unknown time  . vitamin B-12 (CYANOCOBALAMIN) 1000 MCG tablet Take 1,000 mcg by mouth daily.   07/21/2015 at Unknown time  . vitamin C (ASCORBIC ACID) 500 MG tablet Take 500 mg by mouth daily.   07/21/2015 at Unknown time   Scheduled:   . aspirin  81 mg Oral Daily  . cholecalciferol  1,000 Units Oral Daily  . citalopram  20 mg Oral Daily  . metoprolol tartrate  12.5 mg Oral BID  .  morphine injection  4 mg Intravenous Once  . multivitamin with minerals  1 tablet Oral Daily  . simvastatin  40 mg Oral q1800  . vitamin B-12  1,000 mcg Oral Daily  . vitamin C  500 mg Oral Daily    Assessment: 64yo female c/o exertional intermittent CP radiating to throat and associated w/ SOB x1wk, today had sudden onset of chest tightness/pressure w/ SOB, diaphoresis, and nausea, i-stat troponin negative but now trending up, to begin heparin 12/18. No anticoag pta.  HL therapeutic (0.7) but at top of range on 750 units/h. CBC wnl. No bleed/IV line issues per RN. LHC 12/20  Goal of Therapy:  Heparin level 0.3-0.7 units/ml Monitor platelets by anticoagulation protocol: Yes   Plan:  Decrease heparin gtt slightly to 700 units/h to keep in range. 6h HL, daily HL/CBC Mon s/sx bleeding LHC 12/20.   Elicia Lamp, PharmD, BCPS Clinical Pharmacist Pager [phone] 07/22/2015 10:03 AM

## 2015-07-22 NOTE — Consult Note (Signed)
CARDIOLOGY CONSULT NOTE   Patient ID: LASHAWNA Lozano MRN: [id number], DOB/AGE: April 18, 1951   Admit date: 07/21/2015 Date of Consult: 07/22/2015 Reason for Consult: Chest Pain   Primary Physician: Reginia Naas, MD Primary Cardiologist: New  HPI: Kaitlyn Lozano is a 64 y.o. female with past medical history of depression and significant cardiac family history who presented to [facility] ED on 07/21/2015 for new-onset chest pain.  She reports having episodes of chest pressure with exertion for the past week. 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 Over the past week, she develops chest pressure, dyspnea, and diaphoresis with this activity. The pressure is relieved with activity.  Yesterday while sitting in church, she developed the chest pressure again, rating it as a 10/10. Was associated with nausea and diaphoresis. She presented to the ED for this and initially thought this was due to being on prednisone for the past week due to one bursitis.  While admitted, her D-dimer was negative. Cyclic troponin values have been 0.09, 0.10, and 0.09. LDL at 69. CXR with no active cardiopulmonary disease. Her initial EKG showed nonspecific T-wave abnormalities. Repeat EKG this AM shows new TWI in the anterior leads. Reports significant improvement in her pain with SL NTG and NTG ointment.  She has been started on Heparin. Is currently chest pain free.   Problem List Past Medical History  Diagnosis Date  . Depression   . Depression   . Bronchitis     eosinophilic    Past Surgical History  Procedure Laterality Date  . Dnc    . Dilation and curettage of uterus  1993-1994  . Hernia repair       Allergies  Allergies  Allergen Reactions  . Sulfa Antibiotics Rash  . Sulfonamide Derivatives Rash      Inpatient Medications  . cholecalciferol  1,000 Units Oral Daily  . citalopram  20 mg Oral Daily  .  morphine  injection  4 mg Intravenous Once  . multivitamin with minerals  1 tablet Oral Daily  . simvastatin  40 mg Oral q1800  . vitamin B-12  1,000 mcg Oral Daily  . vitamin C  500 mg Oral Daily    Family History Family History  Problem Relation Age of Onset  . Atrial fibrillation Mother   . Transient ischemic attack Mother   . Hypertension Mother   . Stroke Mother   . Heart failure Mother   . Heart failure Maternal Grandmother   . Heart attack Maternal Grandfather   . Aortic stenosis Father      Social History Social History   Social History  . Marital Status: Married    Spouse Name: N/A  . Number of Children: N/A  . Years of Education: N/A   Occupational History  . Not on file.   Social History Main Topics  . Smoking status: Never Smoker   . Smokeless tobacco: Never Used  . Alcohol Use: Yes     Comment: rare  . Drug Use: No  . Sexual Activity: Not on file   Other Topics Concern  . Not on file   Social History Narrative     Review of Systems General:  No chills, fever, night sweats or weight changes.  Cardiovascular:  No edema, orthopnea, palpitations, paroxysmal nocturnal dyspnea. Positive for chest pain,  dyspnea with exertion, and diaphoresis. Dermatological: No rash, lesions/masses Respiratory: No cough, dyspnea Urologic: No hematuria, dysuria  Abdominal:   No vomiting, diarrhea, bright red blood per rectum, melena, or hematemesis. Positive for nausea. Neurologic:  No visual changes, wkns, changes in mental status. All other systems reviewed and are otherwise negative except as noted above.  Physical Exam Blood pressure 108/55, pulse 81, temperature 97.9 F (36.6 C), temperature source Oral, resp. rate 16, height 5\' 4"  (1.626 m), weight 140 lb (63.504 kg), SpO2 99 %.  General: Pleasant, Caucasian female in NAD Psych: Normal affect. Neuro: Alert and oriented X 3. Moves all extremities spontaneously. HEENT: Normal  Neck: Supple without bruits or JVD. Lungs:   Resp regular and unlabored, CTA without wheezing or rales. Heart: RRR no s3, s4, or murmurs. Abdomen: Soft, non-tender, non-distended, BS + x 4.  Extremities: No clubbing, cyanosis or edema. DP/PT/Radials 2+ and equal bilaterally.  Labs  Recent Labs  07/21/15 2142 07/22/15 0025 07/22/15 0304  TROPONINI 0.09* 0.10* 0.09*   Lab Results  Component Value Date   WBC 13.9* 07/21/2015   HGB 15.5* 07/21/2015   HCT 43.6 07/21/2015   MCV 93.8 07/21/2015   PLT 345 07/21/2015     Recent Labs Lab 07/21/15 1228  NA 138  K 4.5  CL 102  CO2 26  BUN 15  CREATININE 0.87  CALCIUM 9.4  GLUCOSE 86   Lab Results  Component Value Date   CHOL 152 07/22/2015   HDL 61 07/22/2015   LDLCALC 69 07/22/2015   TRIG 112 07/22/2015   Lab Results  Component Value Date   DDIMER 0.33 07/21/2015    Radiology/Studies Dg Chest 2 View: 07/21/2015  CLINICAL DATA:  Chest pain radiating into the throat. EXAM: CHEST  2 VIEW COMPARISON:  None. FINDINGS: Normal heart size. Lungs are hyperaerated and clear. No pneumothorax or pleural effusion. IMPRESSION: No active cardiopulmonary disease. Electronically Signed   By: Marybelle Killings M.D.   On: 07/21/2015 13:16    ECG: NSR, rate in 80's. New T-wave inversions in anterior leads.   ECHOCARDIOGRAM: Pending   ASSESSMENT AND PLAN  1. NSTEMI - presents with episodes of chest pressure, dyspnea, and diaphoresis with physical exertion over the past week. Has been relieved with SL NTG. - cyclic troponin values have been 0.09, 0.10, and 0.09. Lipid panel checked and LDL at 69.  - Repeat EKG this AM shows new TWI in the anterior leads.  - Currently chest pain free - Will obtain echocardiogram. Plan for [facility] tomorrow (07/23/2015). Make NPO after midnight. - continue Heparin. Has been started on statin. Will start low-dose BB.    Signed, Erma Heritage, PA-C 07/22/2015, 7:45 AM Pager: [phone]  Patient seen and examined and history reviewed. Agree  with above findings and plan. 64 yo WF generally healthy presents with 2 week history of accelerating chest pain. Pain is midsternal and associated with dyspnea, diaphoresis, and nausea. Pain worsened yesterday at rest. Even after admission continued to have chest pain until late last night. Now pain free. Only cardiac risk factor is family history.  On exam lungs are clear. CV without gallop or murmur. Pulses 2+ throughout. No edema.  Labs are remarkable for dynamic T wave inversion in the anterior leads. Troponin levels are mildly elevated with flat trend.  Differential includes ACS with NSTEMI due to plaque rupture versus stress induced cardiomyopathy.  Recommend medical therapy with ASA, metoprolol, statin, and IV heparin and Ntg.  Plan cardiac cath. The procedure and risks were reviewed including but not limited to death, myocardial infarction, stroke, arrythmias, bleeding, transfusion, emergency surgery,  dye allergy, or renal dysfunction. The patient voices understanding and is agreeable to proceed. The schedule today is full so unless there is a cancellation we will plan on cardiac cath in am. Will obtain Ecg today.  If her chest pain recurs she will need to be done urgently.    Electa Sterry [place], [place] 07/22/2015 7:59 AM

## 2015-07-22 NOTE — Progress Notes (Signed)
ANTICOAGULATION CONSULT NOTE - Follow Up Consult  Pharmacy Consult for Heparin Indication: chest pain/ACS  Allergies  Allergen Reactions  . Sulfa Antibiotics Rash  . Sulfonamide Derivatives Rash    Patient Measurements: Height: 5\' 4"  (162.6 cm) Weight: 140 lb (63.504 kg) IBW/kg (Calculated) : 54.7 Heparin Dosing Weight: 63.5 kg  Vital Signs: Temp: 99 F (37.2 C) (12/19 1429) Temp Source: Oral (12/19 1429) BP: 117/51 mmHg (12/19 1429) Pulse Rate: 75 (12/19 1429)  Labs:  Recent Labs  07/21/15 1228 07/21/15 2142 07/22/15 0025 07/22/15 0304 07/22/15 0840 07/22/15 1600  HGB 15.5*  --   --   --  13.7  --   HCT 43.6  --   --   --  38.6  --   PLT 345  --   --   --  264  --   HEPARINUNFRC  --   --   --   --  0.70 0.16*  CREATININE 0.87  --   --   --   --   --   TROPONINI  --  0.09* 0.10* 0.09*  --   --     Estimated Creatinine Clearance: 55.7 mL/min (by C-G formula based on Cr of 0.87).   Medications:  Heparin @ 700 units/hr ( 7 ml/hr)  Assessment: 64 YOF who continues on heparin for CP/ACS with a SUBtherapeutic heparin level this afternoon (HL 0.16 << 0.7, goal of 0.3-0.7). It is noted that the patient lost IV access this afternoon for 2.5 hours. Drip was restarted at 1500 this afternoon and the heparin level was drawn at 1600, so level this afternoon is inaccurate. Will continue at 700 units/hr (7 ml/hr) and will follow-up with another HL 6 hours after the drip was restarted this afternoon.   Goal of Therapy:  Heparin level 0.3-0.7 units/ml Monitor platelets by anticoagulation protocol: Yes   Plan:  1. Continue heparin at 700 units/hr 2. Will continue to monitor for any signs/symptoms of bleeding and will follow up with heparin level 6 hours after the drip was restarted this afternoon  Alycia Rossetti, PharmD, BCPS Clinical Pharmacist Pager: [phone] 07/22/2015 5:20 PM

## 2015-07-22 NOTE — Progress Notes (Signed)
  Echocardiogram 2D Echocardiogram has been performed.  Jennette Dubin 07/22/2015, 2:12 PM

## 2015-07-22 NOTE — Progress Notes (Signed)
[facility] PROGRESS NOTE  Kaitlyn Lozano [id number] DOB: 1951-06-18 DOA: 07/21/2015 PCP: Reginia Naas, MD  Length of stay:    Assessment/Plan: Principal Problem:   Chest pain Active Problems:   Depression   Shortness of breath   Pain in the chest    Assessment and plan Chest pain: acute. Patient with history is suspicious for the possibility of myocardial injury given chest pain and pressure with exertion and now rest. Heart score calculated to be 3 as she has no other family history or cardiac risk factors except for age. Initial EKG shows normal sinus rhythm and troponin abnormal 0.09> 0.10>0.09 Continue telemetry bed Differential includes ACS with NSTEMI due to plaque rupture versus stress induced cardiomyopathy.  Recommend medical therapy with ASA, metoprolol, statin, and IV heparin and Ntg.  DC nitroglycerin drip given headache and continue Nitropaste Cardiology plans to perform a cardiac cath   Shortness of breath: intermittent with complaints of chest pain. O2 sats remained within normal limits. Chest x-ray showed no acute signs of infiltrate. - Nasal cannula oxygen for shortness of breath symptoms   Depression -continue Celexa    DVT prophylaxsis heparin drip  Code Status:      Code Status Orders        Start     Ordered   07/21/15 2110  Full code   Continuous     07/21/15 2110      Family Communication: Discussed in detail with the patient, all imaging results, lab results explained to the patient   Disposition Plan:  As above    Brief narrative: 64 y.o. female with past medical history of depression and significant cardiac family history who presented to [facility] ED on 07/21/2015 for new-onset chest pain.  She reports having episodes of chest pressure with exertion for the past week. 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 Over the past week, she  develops chest pressure, dyspnea, and diaphoresis with this activity. The pressure is relieved with activity.  Yesterday while sitting in church, she developed the chest pressure again, rating it as a 10/10. Was associated with nausea and diaphoresis. She presented to the ED for this and initially thought this was due to being on prednisone for the past week due to one bursitis.  While admitted, her D-dimer was negative. Cyclic troponin values have been 0.09, 0.10, and 0.09. LDL at 69. CXR with no active cardiopulmonary disease. Her initial EKG showed nonspecific T-wave abnormalities. Repeat EKG this AM shows new TWI in the anterior leads. Reports significant improvement in her pain with SL NTG and NTG ointment.  She has been started on Heparin. Is currently chest pain free.    Consultants:  Cardiology*  Procedures:  none  Antibiotics: Anti-infectives    None         HPI/Subjective: Patient has a slight headache, no nausea no vomiting or chest pain  Objective: Filed Vitals:   07/21/15 2030 07/21/15 2203 07/22/15 0423 07/22/15 0835  BP: 126/71 111/59 108/55 112/55  Pulse: 98 92 81 90  Temp:  97.9 F (36.6 C) 97.9 F (36.6 C)   TempSrc:  Oral Oral   Resp: 12 18 16    Height:      Weight:      SpO2: 99% 98% 99%    No intake or output data in the 24 hours ending 07/22/15 1059  Exam:  General: No  acute respiratory distress Lungs: Clear to auscultation bilaterally without wheezes or crackles Cardiovascular: Regular rate and rhythm without murmur gallop or rub normal S1 and S2 Abdomen: Nontender, nondistended, soft, bowel sounds positive, no rebound, no ascites, no appreciable mass Extremities: No significant cyanosis, clubbing, or edema bilateral lower extremities     Data Review   Micro Results No results found for this or any previous visit (from the past 240 hour(s)).  Radiology Reports Dg Chest 2 View  07/21/2015  CLINICAL DATA:  Chest pain radiating into  the throat. EXAM: CHEST  2 VIEW COMPARISON:  None. FINDINGS: Normal heart size. Lungs are hyperaerated and clear. No pneumothorax or pleural effusion. IMPRESSION: No active cardiopulmonary disease. Electronically Signed   By: Marybelle Killings M.D.   On: 07/21/2015 13:16     CBC  Recent Labs Lab 07/21/15 1228 07/22/15 0840  WBC 13.9* 10.1  HGB 15.5* 13.7  HCT 43.6 38.6  PLT 345 264  MCV 93.8 94.1  MCH 33.3 33.4  MCHC 35.6 35.5  RDW 14.8 14.9    Chemistries   Recent Labs Lab 07/21/15 1228  NA 138  K 4.5  CL 102  CO2 26  GLUCOSE 86  BUN 15  CREATININE 0.87  CALCIUM 9.4   ------------------------------------------------------------------------------------------------------------------ estimated creatinine clearance is 55.7 mL/min (by C-G formula based on Cr of 0.87). ------------------------------------------------------------------------------------------------------------------ No results for input(s): HGBA1C in the last 72 hours. ------------------------------------------------------------------------------------------------------------------  Recent Labs  07/22/15 0304  CHOL 152  HDL 61  LDLCALC 69  TRIG 112  CHOLHDL 2.5   ------------------------------------------------------------------------------------------------------------------ No results for input(s): TSH, T4TOTAL, T3FREE, THYROIDAB in the last 72 hours.  Invalid input(s): FREET3 ------------------------------------------------------------------------------------------------------------------ No results for input(s): VITAMINB12, FOLATE, FERRITIN, TIBC, IRON, RETICCTPCT in the last 72 hours.  Coagulation profile No results for input(s): INR, PROTIME in the last 168 hours.   Recent Labs  07/21/15 1730  DDIMER 0.33    Cardiac Enzymes  Recent Labs Lab 07/21/15 2142 07/22/15 0025 07/22/15 0304  TROPONINI 0.09* 0.10* 0.09*    ------------------------------------------------------------------------------------------------------------------ Invalid input(s): POCBNP   CBG: No results for input(s): GLUCAP in the last 168 hours.     Studies: Dg Chest 2 View  07/21/2015  CLINICAL DATA:  Chest pain radiating into the throat. EXAM: CHEST  2 VIEW COMPARISON:  None. FINDINGS: Normal heart size. Lungs are hyperaerated and clear. No pneumothorax or pleural effusion. IMPRESSION: No active cardiopulmonary disease. Electronically Signed   By: Marybelle Killings M.D.   On: 07/21/2015 13:16      No results found for: HGBA1C Lab Results  Component Value Date   LDLCALC 69 07/22/2015   CREATININE 0.87 07/21/2015       Scheduled Meds: . aspirin  81 mg Oral Daily  . cholecalciferol  1,000 Units Oral Daily  . citalopram  20 mg Oral Daily  . metoprolol tartrate  12.5 mg Oral BID  .  morphine injection  4 mg Intravenous Once  . multivitamin with minerals  1 tablet Oral Daily  . nitroGLYCERIN  1 inch Topical Once  . simvastatin  40 mg Oral q1800  . vitamin B-12  1,000 mcg Oral Daily  . vitamin C  500 mg Oral Daily   Continuous Infusions: . heparin 700 Units/hr (07/22/15 1057)    Principal Problem:   Chest pain Active Problems:   Depression   Shortness of breath   Pain in the chest    Time spent: 45 minutes   [facility] Pager [phone]. If 7PM-7AM, please  contact night-coverage at [email or web address], password [facility] 07/22/2015, 10:59 AM

## 2015-07-22 NOTE — Research (Signed)
[place] Study Informed Consent   Subject Name: Kaitlyn Lozano  Subject met inclusion and exclusion criteria.  The informed consent form, study requirements and expectations were reviewed with the subject and questions and concerns were addressed prior to the signing of the consent form.  The subject verbalized understanding of the trail requirements.  The subject agreed to participate in the [place] and signed the informed consent.  The informed consent was obtained prior to performance of any protocol-specific procedures for the subject.  A copy of the signed informed consent was given to the subject and a copy was placed in the subject's medical record.  [name] 07/22/2015, 14:25

## 2015-07-23 ENCOUNTER — Encounter (HOSPITAL_COMMUNITY): Payer: Self-pay | Admitting: Cardiology

## 2015-07-23 ENCOUNTER — Encounter (HOSPITAL_COMMUNITY)
Admission: EM | Disposition: A | Discharge status: Home / Self Care | Payer: Self-pay | Source: Home / Self Care | Attending: Internal Medicine

## 2015-07-23 DIAGNOSIS — R9431 Abnormal electrocardiogram [ECG] [EKG]: Secondary | ICD-10-CM | POA: Diagnosis present

## 2015-07-23 DIAGNOSIS — Z8249 Family history of ischemic heart disease and other diseases of the circulatory system: Secondary | ICD-10-CM | POA: Diagnosis not present

## 2015-07-23 DIAGNOSIS — I25110 Atherosclerotic heart disease of native coronary artery with unstable angina pectoris: Secondary | ICD-10-CM

## 2015-07-23 DIAGNOSIS — Z882 Allergy status to sulfonamides status: Secondary | ICD-10-CM | POA: Diagnosis not present

## 2015-07-23 DIAGNOSIS — Y840 Cardiac catheterization as the cause of abnormal reaction of the patient, or of later complication, without mention of misadventure at the time of the procedure: Secondary | ICD-10-CM | POA: Diagnosis not present

## 2015-07-23 DIAGNOSIS — I249 Acute ischemic heart disease, unspecified: Secondary | ICD-10-CM | POA: Diagnosis not present

## 2015-07-23 DIAGNOSIS — Z006 Encounter for examination for normal comparison and control in clinical research program: Secondary | ICD-10-CM | POA: Diagnosis not present

## 2015-07-23 DIAGNOSIS — F329 Major depressive disorder, single episode, unspecified: Secondary | ICD-10-CM | POA: Diagnosis present

## 2015-07-23 DIAGNOSIS — I97410 Intraoperative hemorrhage and hematoma of a circulatory system organ or structure complicating a cardiac catheterization: Secondary | ICD-10-CM | POA: Diagnosis not present

## 2015-07-23 DIAGNOSIS — I214 Non-ST elevation (NSTEMI) myocardial infarction: Secondary | ICD-10-CM | POA: Diagnosis present

## 2015-07-23 DIAGNOSIS — I97630 Postprocedural hematoma of a circulatory system organ or structure following a cardiac catheterization: Secondary | ICD-10-CM | POA: Diagnosis not present

## 2015-07-23 DIAGNOSIS — R0789 Other chest pain: Secondary | ICD-10-CM | POA: Diagnosis present

## 2015-07-23 HISTORY — PX: CARDIAC CATHETERIZATION: SHX172

## 2015-07-23 LAB — CBC
HEMATOCRIT: 36.2 % (ref 36.0–46.0)
HEMOGLOBIN: 13 g/dL (ref 12.0–15.0)
MCH: 33.9 pg (ref 26.0–34.0)
MCHC: 35.9 g/dL (ref 30.0–36.0)
MCV: 94.3 fL (ref 78.0–100.0)
Platelets: 247 10*3/uL (ref 150–400)
RBC: 3.84 MIL/uL — ABNORMAL LOW (ref 3.87–5.11)
RDW: 15.4 % (ref 11.5–15.5)
WBC: 12.9 10*3/uL — AB (ref 4.0–10.5)

## 2015-07-23 LAB — POCT ACTIVATED CLOTTING TIME
ACTIVATED CLOTTING TIME: 204 s
ACTIVATED CLOTTING TIME: 152 s
Activated Clotting Time: 204 seconds
Activated Clotting Time: 265 seconds

## 2015-07-23 LAB — PROTIME-INR
INR: 1.19 (ref 0.00–1.49)
Prothrombin Time: 15.3 seconds — ABNORMAL HIGH (ref 11.6–15.2)

## 2015-07-23 LAB — HEPARIN LEVEL (UNFRACTIONATED): Heparin Unfractionated: 0.31 IU/mL (ref 0.30–0.70)

## 2015-07-23 SURGERY — LEFT HEART CATH AND CORONARY ANGIOGRAPHY

## 2015-07-23 MED ORDER — TICAGRELOR 90 MG PO TABS
ORAL_TABLET | ORAL | Status: DC | PRN
Start: 2015-07-23 — End: 2015-07-23
  Administered 2015-07-23: 180 mg via ORAL

## 2015-07-23 MED ORDER — HEPARIN (PORCINE) IN NACL 2-0.9 UNIT/ML-% IJ SOLN
INTRAMUSCULAR | Status: AC
  Filled 2015-07-23: qty 1500

## 2015-07-23 MED ORDER — ATROPINE SULFATE 0.1 MG/ML IJ SOLN
INTRAMUSCULAR | Status: AC
  Filled 2015-07-23: qty 10

## 2015-07-23 MED ORDER — IOHEXOL 350 MG/ML SOLN
INTRAVENOUS | Status: DC | PRN
  Administered 2015-07-23 (×2): 100 mL via INTRAVENOUS

## 2015-07-23 MED ORDER — ONDANSETRON HCL 4 MG/2ML IJ SOLN
INTRAMUSCULAR | Status: DC | PRN
  Administered 2015-07-23: 4 mg via INTRAVENOUS

## 2015-07-23 MED ORDER — FENTANYL CITRATE (PF) 100 MCG/2ML IJ SOLN
INTRAMUSCULAR | Status: DC | PRN
  Administered 2015-07-23 (×2): 25 ug via INTRAVENOUS

## 2015-07-23 MED ORDER — HEART ATTACK BOUNCING BOOK
Freq: Once | Status: AC
  Administered 2015-07-23: 21:00:00
  Filled 2015-07-23: qty 1

## 2015-07-23 MED ORDER — TICAGRELOR 90 MG PO TABS
ORAL_TABLET | ORAL | Status: AC
  Filled 2015-07-23: qty 2

## 2015-07-23 MED ORDER — METOPROLOL TARTRATE 25 MG PO TABS: 12.5000 mg | ORAL_TABLET | Freq: Two times a day (BID) | ORAL | Status: DC

## 2015-07-23 MED ORDER — TICAGRELOR 90 MG PO TABS: 90.0000 mg | ORAL_TABLET | Freq: Two times a day (BID) | ORAL | Status: DC

## 2015-07-23 MED ORDER — HEPARIN SODIUM (PORCINE) 1000 UNIT/ML IJ SOLN
INTRAMUSCULAR | Status: AC
  Filled 2015-07-23: qty 1

## 2015-07-23 MED ORDER — NITROGLYCERIN 1 MG/10 ML FOR IR/CATH LAB
INTRA_ARTERIAL | Status: DC | PRN
  Administered 2015-07-23: 200 ug

## 2015-07-23 MED ORDER — HEPARIN SODIUM (PORCINE) 1000 UNIT/ML IJ SOLN
INTRAMUSCULAR | Status: DC | PRN
  Administered 2015-07-23: 3000 [IU] via INTRAVENOUS
  Administered 2015-07-23: 5500 [IU] via INTRAVENOUS

## 2015-07-23 MED ORDER — NITROGLYCERIN 1 MG/10 ML FOR IR/CATH LAB
INTRA_ARTERIAL | Status: DC | PRN
  Administered 2015-07-23: 09:00:00

## 2015-07-23 MED ORDER — HEPARIN (PORCINE) IN NACL 2-0.9 UNIT/ML-% IJ SOLN
INTRAMUSCULAR | Status: DC | PRN
  Administered 2015-07-23: 10 mL via INTRA_ARTERIAL

## 2015-07-23 MED ORDER — NITROGLYCERIN 1 MG/10 ML FOR IR/CATH LAB
INTRA_ARTERIAL | Status: AC
  Filled 2015-07-23: qty 10

## 2015-07-23 MED ORDER — LIDOCAINE HCL (PF) 1 % IJ SOLN
INTRAMUSCULAR | Status: DC | PRN
  Administered 2015-07-23: 10 mL
  Administered 2015-07-23: 2 mL

## 2015-07-23 MED ORDER — ANGIOPLASTY BOOK
Freq: Once | Status: AC
  Administered 2015-07-23: 21:00:00
  Filled 2015-07-23: qty 1

## 2015-07-23 MED ORDER — LIDOCAINE HCL (PF) 1 % IJ SOLN
INTRAMUSCULAR | Status: DC | PRN
  Administered 2015-07-23: 20 mL via INTRA_ARTERIAL

## 2015-07-23 MED ORDER — SODIUM CHLORIDE 0.9 % WEIGHT BASED INFUSION: 1.0000 mL/kg/h | INTRAVENOUS | Status: AC

## 2015-07-23 MED ORDER — ATORVASTATIN CALCIUM 80 MG PO TABS
80.0000 mg | ORAL_TABLET | Freq: Every day | ORAL | Status: DC
  Administered 2015-07-23 – 2015-07-24 (×2): 80 mg via ORAL
  Filled 2015-07-23 (×2): qty 1

## 2015-07-23 MED ORDER — TICAGRELOR 90 MG PO TABS
90.0000 mg | ORAL_TABLET | Freq: Two times a day (BID) | ORAL | Status: DC
  Administered 2015-07-23 – 2015-07-24 (×2): 90 mg via ORAL
  Filled 2015-07-23 (×2): qty 1

## 2015-07-23 MED ORDER — SODIUM CHLORIDE 0.9 % IV SOLN: 250.0000 mL | INTRAVENOUS | Status: DC | PRN

## 2015-07-23 MED ORDER — VERAPAMIL HCL 2.5 MG/ML IV SOLN
INTRAVENOUS | Status: AC
  Filled 2015-07-23: qty 2

## 2015-07-23 MED ORDER — FENTANYL CITRATE (PF) 100 MCG/2ML IJ SOLN
INTRAMUSCULAR | Status: AC
  Filled 2015-07-23: qty 2

## 2015-07-23 MED ORDER — MIDAZOLAM HCL 2 MG/2ML IJ SOLN
INTRAMUSCULAR | Status: DC | PRN
  Administered 2015-07-23 (×2): 1 mg via INTRAVENOUS

## 2015-07-23 MED ORDER — SODIUM CHLORIDE 0.9 % IJ SOLN: 3.0000 mL | INTRAMUSCULAR | Status: DC | PRN

## 2015-07-23 MED ORDER — ATORVASTATIN CALCIUM 80 MG PO TABS: 80.0000 mg | ORAL_TABLET | Freq: Every day | ORAL | Status: DC

## 2015-07-23 MED ORDER — ASPIRIN 81 MG PO CHEW: 81.0000 mg | CHEWABLE_TABLET | Freq: Every day | ORAL | Status: DC

## 2015-07-23 MED ORDER — SODIUM CHLORIDE 0.9 % IJ SOLN
3.0000 mL | Freq: Two times a day (BID) | INTRAMUSCULAR | Status: DC
  Administered 2015-07-23 – 2015-07-24 (×2): 3 mL via INTRAVENOUS

## 2015-07-23 MED ORDER — LIDOCAINE HCL (PF) 1 % IJ SOLN
INTRAMUSCULAR | Status: AC
  Filled 2015-07-23: qty 30

## 2015-07-23 MED ORDER — MIDAZOLAM HCL 2 MG/2ML IJ SOLN
INTRAMUSCULAR | Status: AC
  Filled 2015-07-23: qty 2

## 2015-07-23 SURGICAL SUPPLY — 20 items
BALLN EUPHORA RX 2.0X15 (BALLOONS) ×3
BALLOON EUPHORA RX 2.0X15 (BALLOONS) IMPLANT
CATH INFINITI 5FR MULTPACK ANG (CATHETERS) ×2 IMPLANT
CATH OPTITORQUE TIG 4.0 5F (CATHETERS) ×3 IMPLANT
CATH VISTA GUIDE 6FR XBLAD3.5 (CATHETERS) ×2 IMPLANT
DEVICE RAD COMP TR BAND LRG (VASCULAR PRODUCTS) ×3 IMPLANT
GLIDESHEATH SLEND A-KIT 6F 22G (SHEATH) ×3 IMPLANT
KIT ENCORE 26 ADVANTAGE (KITS) ×2 IMPLANT
KIT HEART LEFT (KITS) ×3 IMPLANT
PACK CARDIAC CATHETERIZATION (CUSTOM PROCEDURE TRAY) ×3 IMPLANT
SHEATH PINNACLE 5F 10CM (SHEATH) ×2 IMPLANT
SHEATH PINNACLE 6F 10CM (SHEATH) ×2 IMPLANT
STENT SYNERGY DES 2.5X16 (Permanent Stent) ×2 IMPLANT
SYR MEDRAD MARK V 150ML (SYRINGE) ×3 IMPLANT
TRANSDUCER W/STOPCOCK (MISCELLANEOUS) ×3 IMPLANT
TUBING CIL FLEX 10 FLL-RA (TUBING) ×3 IMPLANT
WIRE EMERALD 3MM-J .035X150CM (WIRE) ×2 IMPLANT
WIRE HI TORQ BMW 190CM (WIRE) ×2 IMPLANT
WIRE HI TORQ VERSACORE-J 145CM (WIRE) ×2 IMPLANT
WIRE SAFE-T 1.5MM-J .035X260CM (WIRE) ×3 IMPLANT

## 2015-07-23 NOTE — Progress Notes (Signed)
Site area: right groin  Site Prior to Removal:  Level 1  Pressure Applied For 20 MINUTES    Minutes Beginning at 1450  Manual:   Yes.    Patient Status During Pull:  AAO X 4  Post Pull Groin Site:  Level 1  Post Pull Instructions Given:  Yes.    Post Pull Pulses Present:  Yes.    Dressing Applied:  Yes.    Comments:  Tolerated procedure well

## 2015-07-23 NOTE — H&P (View-Only) (Signed)
CARDIOLOGY CONSULT NOTE   Patient ID: Kaitlyn Lozano MRN: [id number], DOB/AGE: [birth year]   Admit date: 07/21/2015 Date of Consult: 07/22/2015 Reason for Consult: Chest Pain   Primary Physician: Reginia Naas, MD Primary Cardiologist: New  HPI: Kaitlyn Lozano is a 64 y.o. female with past medical history of depression and significant cardiac family history who presented to [facility] ED on 07/21/2015 for new-onset chest pain.  She reports having episodes of chest pressure with exertion for the past week. 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 Over the past week, she develops chest pressure, dyspnea, and diaphoresis with this activity. The pressure is relieved with activity.  Yesterday while sitting in church, she developed the chest pressure again, rating it as a 10/10. Was associated with nausea and diaphoresis. She presented to the ED for this and initially thought this was due to being on prednisone for the past week due to one bursitis.  While admitted, her D-dimer was negative. Cyclic troponin values have been 0.09, 0.10, and 0.09. LDL at 69. CXR with no active cardiopulmonary disease. Her initial EKG showed nonspecific T-wave abnormalities. Repeat EKG this AM shows new TWI in the anterior leads. Reports significant improvement in her pain with SL NTG and NTG ointment.  She has been started on Heparin. Is currently chest pain free.   Problem List Past Medical History  Diagnosis Date  . Depression   . Depression   . Bronchitis     eosinophilic    Past Surgical History  Procedure Laterality Date  . Dnc    . Dilation and curettage of uterus  1993-1994  . Hernia repair       Allergies  Allergies  Allergen Reactions  . Sulfa Antibiotics Rash  . Sulfonamide Derivatives Rash      Inpatient Medications  . cholecalciferol  1,000 Units Oral Daily  . citalopram  20 mg Oral Daily  .  morphine  injection  4 mg Intravenous Once  . multivitamin with minerals  1 tablet Oral Daily  . simvastatin  40 mg Oral q1800  . vitamin B-12  1,000 mcg Oral Daily  . vitamin C  500 mg Oral Daily    Family History Family History  Problem Relation Age of Onset  . Atrial fibrillation Mother   . Transient ischemic attack Mother   . Hypertension Mother   . Stroke Mother   . Heart failure Mother   . Heart failure Maternal Grandmother   . Heart attack Maternal Grandfather   . Aortic stenosis Father      Social History Social History   Social History  . Marital Status: Married    Spouse Name: N/A  . Number of Children: N/A  . Years of Education: N/A   Occupational History  . Not on file.   Social History Main Topics  . Smoking status: Never Smoker   . Smokeless tobacco: Never Used  . Alcohol Use: Yes     Comment: rare  . Drug Use: No  . Sexual Activity: Not on file   Other Topics Concern  . Not on file   Social History Narrative     Review of Systems General:  No chills, fever, night sweats or weight changes.  Cardiovascular:  No edema, orthopnea, palpitations, paroxysmal nocturnal dyspnea. Positive for chest pain,  dyspnea with exertion, and diaphoresis. Dermatological: No rash, lesions/masses Respiratory: No cough, dyspnea Urologic: No hematuria, dysuria  Abdominal:   No vomiting, diarrhea, bright red blood per rectum, melena, or hematemesis. Positive for nausea. Neurologic:  No visual changes, wkns, changes in mental status. All other systems reviewed and are otherwise negative except as noted above.  Physical Exam Blood pressure 108/55, pulse 81, temperature 97.9 F (36.6 C), temperature source Oral, resp. rate 16, height 5\' 4"  (1.626 m), weight 140 lb (63.504 kg), SpO2 99 %.  General: Pleasant, Caucasian female in NAD Psych: Normal affect. Neuro: Alert and oriented X 3. Moves all extremities spontaneously. HEENT: Normal  Neck: Supple without bruits or JVD. Lungs:   Resp regular and unlabored, CTA without wheezing or rales. Heart: RRR no s3, s4, or murmurs. Abdomen: Soft, non-tender, non-distended, BS + x 4.  Extremities: No clubbing, cyanosis or edema. DP/PT/Radials 2+ and equal bilaterally.  Labs  Recent Labs  07/21/15 2142 07/22/15 0025 07/22/15 0304  TROPONINI 0.09* 0.10* 0.09*   Lab Results  Component Value Date   WBC 13.9* 07/21/2015   HGB 15.5* 07/21/2015   HCT 43.6 07/21/2015   MCV 93.8 07/21/2015   PLT 345 07/21/2015     Recent Labs Lab 07/21/15 1228  NA 138  K 4.5  CL 102  CO2 26  BUN 15  CREATININE 0.87  CALCIUM 9.4  GLUCOSE 86   Lab Results  Component Value Date   CHOL 152 07/22/2015   HDL 61 07/22/2015   LDLCALC 69 07/22/2015   TRIG 112 07/22/2015   Lab Results  Component Value Date   DDIMER 0.33 07/21/2015    Radiology/Studies Dg Chest 2 View: 07/21/2015  CLINICAL DATA:  Chest pain radiating into the throat. EXAM: CHEST  2 VIEW COMPARISON:  None. FINDINGS: Normal heart size. Lungs are hyperaerated and clear. No pneumothorax or pleural effusion. IMPRESSION: No active cardiopulmonary disease. Electronically Signed   By: Marybelle Killings M.D.   On: 07/21/2015 13:16    ECG: NSR, rate in 80's. New T-wave inversions in anterior leads.   ECHOCARDIOGRAM: Pending   ASSESSMENT AND PLAN  1. NSTEMI - presents with episodes of chest pressure, dyspnea, and diaphoresis with physical exertion over the past week. Has been relieved with SL NTG. - cyclic troponin values have been 0.09, 0.10, and 0.09. Lipid panel checked and LDL at 69.  - Repeat EKG this AM shows new TWI in the anterior leads.  - Currently chest pain free - Will obtain echocardiogram. Plan for [facility] tomorrow (07/23/2015). Make NPO after midnight. - continue Heparin. Has been started on statin. Will start low-dose BB.    Signed, Kaitlyn Heritage, PA-C 07/22/2015, 7:45 AM Pager: [phone]  Patient seen and examined and history reviewed. Agree  with above findings and plan. 64 yo WF generally healthy presents with 2 week history of accelerating chest pain. Pain is midsternal and associated with dyspnea, diaphoresis, and nausea. Pain worsened yesterday at rest. Even after admission continued to have chest pain until late last night. Now pain free. Only cardiac risk factor is family history.  On exam lungs are clear. CV without gallop or murmur. Pulses 2+ throughout. No edema.  Labs are remarkable for dynamic T wave inversion in the anterior leads. Troponin levels are mildly elevated with flat trend.  Differential includes ACS with NSTEMI due to plaque rupture versus stress induced cardiomyopathy.  Recommend medical therapy with ASA, metoprolol, statin, and IV heparin and Ntg.  Plan cardiac cath. The procedure and risks were reviewed including but not limited to death, myocardial infarction, stroke, arrythmias, bleeding, transfusion, emergency surgery,  dye allergy, or renal dysfunction. The patient voices understanding and is agreeable to proceed. The schedule today is full so unless there is a cancellation we will plan on cardiac cath in am. Will obtain Ecg today.  If her chest pain recurs she will need to be done urgently.    Arfa Lamarca [place], [place] 07/22/2015 7:59 AM

## 2015-07-23 NOTE — Progress Notes (Signed)
TR BAND REMOVAL  LOCATION:    right radial  DEFLATED PER PROTOCOL:    Yes.    TIME BAND OFF / DRESSING APPLIED:    1545   SITE UPON ARRIVAL:    Level 0  SITE AFTER BAND REMOVAL:    Level 0  CIRCULATION SENSATION AND MOVEMENT:    Within Normal Limits   Yes.    COMMENTS:   TOLERATED PROCEDURE WELL 

## 2015-07-23 NOTE — Interval H&P Note (Signed)
History and Physical Interval Note:  07/23/2015 7:31 AM  Kaitlyn Lozano  has presented today for surgery, with the diagnosis of Acute Coronary Syndrome / Unstable Angina.   The various methods of treatment have been discussed with the patient and family. After consideration of risks, benefits and other options for treatment, the patient has consented to  Procedure(s): Left Heart Cath and Coronary Angiography (N/A) as a surgical intervention .  The patient's history has been reviewed, patient examined, no change in status, stable for surgery.  I have reviewed the patient's chart and labs.  Questions were answered to the patient's satisfaction.    Cath Lab Visit (complete for each Cath Lab visit)  Clinical Evaluation Leading to the Procedure:   ACS: Yes.    Non-ACS:    Anginal Classification: CCS IV  Anti-ischemic medical therapy: Maximal Therapy (2 or more classes of medications)  Non-Invasive Test Results: No non-invasive testing performed  Prior CABG: No previous CABG  AUC:  TIMI SCORE  Patient Information:  TIMI Score is 4  UA/NSTEMI and intermediate-risk features (e.g., TIMI score 3?4) for short-term risk of death or nonfatal MI  Revascularization of the presumed culprit artery   A (8)  Indication: 10; Score: 8   HARDING, DAVID W

## 2015-07-23 NOTE — Progress Notes (Signed)
ANTICOAGULATION CONSULT NOTE - Follow Up Consult  Pharmacy Consult for heparin Indication: NSTEMI   Labs:  Recent Labs  07/21/15 1228 07/21/15 2142 07/22/15 0025 07/22/15 0304 07/22/15 0840 07/22/15 1600 07/22/15 2307  HGB 15.5*  --   --   --  13.7  --   --   HCT 43.6  --   --   --  38.6  --   --   PLT 345  --   --   --  264  --   --   HEPARINUNFRC  --   --   --   --  0.70 0.16* 0.51  CREATININE 0.87  --   --   --   --   --   --   TROPONINI  --  0.09* 0.10* 0.09*  --   --   --      Assessment/Plan:  64yo female remains therapeutic on heparin. Will continue gtt at current rate and monitor daily levels.   Wynona Neat, PharmD, BCPS  07/23/2015,12:03 AM

## 2015-07-23 NOTE — Discharge Summary (Addendum)
Physician Discharge Summary  TYTIONNA CLOYD MRN: [id number] DOB/AGE: 1950/10/14 64 y.o.  PCP: Reginia Naas, MD   Admit date: 07/21/2015 Discharge date: 07/23/2015  Discharge Diagnoses:     Principal Problem:   Acute coronary syndrome [facility]) Active Problems:   Chest pain   Depression   Shortness of breath   Family history of premature coronary artery disease   Abnormal finding on EKG   Atherosclerosis of native coronary artery of native heart with unstable angina pectoris ([place])   Addendum-discussed with Dr. Ellyn Hack Patient to be discharged home tomorrow as she received a stent to her LAD  Follow-up recommendations Follow-up with PCP in 3-5 days , including all  additional recommended appointments as below Follow-up CBC, CMP in 3-5 days      Medication List    TAKE these medications        acetaminophen 650 MG CR tablet  Commonly known as:  TYLENOL  Take 1,300 mg by mouth at bedtime as needed for pain.     aspirin 81 MG chewable tablet  Chew 1 tablet (81 mg total) by mouth daily.     atorvastatin 80 MG tablet  Commonly known as:  LIPITOR  Take 1 tablet (80 mg total) by mouth daily at 6 PM.     citalopram 20 MG tablet  Commonly known as:  CELEXA  Take 20 mg by mouth daily.     CITRACAL + D PO  Take 1 tablet by mouth 2 (two) times daily.     metoprolol tartrate 25 MG tablet  Commonly known as:  LOPRESSOR  Take 0.5 tablets (12.5 mg total) by mouth 2 (two) times daily.     multivitamin with minerals tablet  Take 1 tablet by mouth daily. Centrum Silver     ticagrelor 90 MG Tabs tablet  Commonly known as:  BRILINTA  Take 1 tablet (90 mg total) by mouth 2 (two) times daily.     Turmeric 500 MG Caps  Take 500 mg by mouth daily.     vitamin B-12 1000 MCG tablet  Commonly known as:  CYANOCOBALAMIN  Take 1,000 mcg by mouth daily.     vitamin C 500 MG tablet  Commonly known as:  ASCORBIC ACID  Take 500 mg by mouth daily.     VITAMIN D PO  Take 1  tablet by mouth daily.         Discharge Condition: *Stable   Discharge Instructions       Discharge Instructions    Diet - low sodium heart healthy    Complete by:  As directed      Increase activity slowly    Complete by:  As directed            Allergies  Allergen Reactions  . Sulfa Antibiotics Rash  . Sulfonamide Derivatives Rash      Disposition: 01-Home or Self Care   Consults:  Cardiology     Significant Diagnostic Studies:  Dg Chest 2 View  07/21/2015  CLINICAL DATA:  Chest pain radiating into the throat. EXAM: CHEST  2 VIEW COMPARISON:  None. FINDINGS: Normal heart size. Lungs are hyperaerated and clear. No pneumothorax or pleural effusion. IMPRESSION: No active cardiopulmonary disease. Electronically Signed   By: Marybelle Killings M.D.   On: 07/21/2015 13:16     2-D echo LV EF: 60% -  65%  ------------------------------------------------------------------- Indications:   Chest pain 786.51.  ------------------------------------------------------------------- History:  PMH:  Dyspnea.  ------------------------------------------------------------------- Study Conclusions  - Left  ventricle: The cavity size was normal. Systolic function was normal. The estimated ejection fraction was in the range of 60% to 65%. Wall motion was normal; there were no regional wall motion abnormalities. - Atrial septum: No defect or patent foramen ovale was identified.  Cardiac cath  Mid LAD lesion, 95% stenosed. PCI with Synergy DES 2.5 mm x 16 mm (2.75 mm). Post intervention, there is a 0% residual stenosis.  Bifurcation OM lesions: 1st Mrg lesion, 60% stenosed. 2nd Mrg lesion, 55% stenosed.  The left ventricular systolic function is normal.   Successful PCI of the most significant culprit lesion in the LAD. There are moderate lesions in the circumflex branches that will be treated medically.  Plan:  Standard Femoral and radial cath  care.  Dual antiplatelet therapy for minimum of 3 months. After which time can stop aspirin and continue Brilinta.  Change from simvastatin to atorvastatin 80 mg.  Anticipate discharge tomorrow.   Filed Weights   07/21/15 1228 07/23/15 0646  Weight: 63.504 kg (140 lb) 65 kg (143 lb 4.8 oz)     Microbiology: No results found for this or any previous visit (from the past 240 hour(s)).     Blood Culture No results found for: SDES, SPECREQUEST, CULT, REPTSTATUS    Labs: Results for orders placed or performed during the hospital encounter of 07/21/15 (from the past 48 hour(s))  Basic metabolic panel     Status: None   Collection Time: 07/21/15 12:28 PM  Result Value Ref Range   Sodium 138 135 - 145 mmol/L   Potassium 4.5 3.5 - 5.1 mmol/L   Chloride 102 101 - 111 mmol/L   CO2 26 22 - 32 mmol/L   Glucose, Bld 86 65 - 99 mg/dL   BUN 15 6 - 20 mg/dL   Creatinine, Ser 0.87 0.44 - 1.00 mg/dL   Calcium 9.4 8.9 - 10.3 mg/dL   GFR calc non Af Amer >60 >60 mL/min   GFR calc Af Amer >60 >60 mL/min    Comment: (NOTE) The eGFR has been calculated using the CKD EPI equation. This calculation has not been validated in all clinical situations. eGFR's persistently <60 mL/min signify possible Chronic Kidney Disease.    Anion gap 10 5 - 15  CBC     Status: Abnormal   Collection Time: 07/21/15 12:28 PM  Result Value Ref Range   WBC 13.9 (H) 4.0 - 10.5 K/uL   RBC 4.65 3.87 - 5.11 MIL/uL   Hemoglobin 15.5 (H) 12.0 - 15.0 g/dL   HCT 43.6 36.0 - 46.0 %   MCV 93.8 78.0 - 100.0 fL   MCH 33.3 26.0 - 34.0 pg   MCHC 35.6 30.0 - 36.0 g/dL   RDW 14.8 11.5 - 15.5 %   Platelets 345 150 - 400 K/uL  I-stat troponin, ED (not at [facility])     Status: None   Collection Time: 07/21/15 12:34 PM  Result Value Ref Range   Troponin i, poc 0.01 0.00 - 0.08 ng/mL   Comment 3            Comment: Due to the release kinetics of cTnI, a negative result within the first hours of the onset of symptoms  does not rule out myocardial infarction with certainty. If myocardial infarction is still suspected, repeat the test at appropriate intervals.   D-dimer, quantitative     Status: None   Collection Time: 07/21/15  5:30 PM  Result Value Ref Range   D-Dimer, Quant 0.33  0.00 - 0.50 ug/mL-FEU    Comment: (NOTE) At the manufacturer cut-off of 0.50 ug/mL FEU, this assay has been documented to exclude PE with a sensitivity and negative predictive value of 97 to 99%.  At this time, this assay has not been approved by the FDA to exclude DVT/VTE. Results should be correlated with clinical presentation.   I-stat troponin, ED     Status: None   Collection Time: 07/21/15  8:05 PM  Result Value Ref Range   Troponin i, poc 0.01 0.00 - 0.08 ng/mL   Comment 3            Comment: Due to the release kinetics of cTnI, a negative result within the first hours of the onset of symptoms does not rule out myocardial infarction with certainty. If myocardial infarction is still suspected, repeat the test at appropriate intervals.   Troponin I-serum (0, 3, 6 hours)     Status: Abnormal   Collection Time: 07/21/15  9:42 PM  Result Value Ref Range   Troponin I 0.09 (H) <0.031 ng/mL    Comment:        PERSISTENTLY INCREASED TROPONIN VALUES IN THE RANGE OF 0.04-0.49 ng/mL CAN BE SEEN IN:       -UNSTABLE ANGINA       -CONGESTIVE HEART FAILURE       -MYOCARDITIS       -CHEST TRAUMA       -ARRYHTHMIAS       -LATE PRESENTING MYOCARDIAL INFARCTION       -COPD   CLINICAL FOLLOW-UP RECOMMENDED.   Troponin I-serum (0, 3, 6 hours)     Status: Abnormal   Collection Time: 07/22/15 12:25 AM  Result Value Ref Range   Troponin I 0.10 (H) <0.031 ng/mL    Comment:        PERSISTENTLY INCREASED TROPONIN VALUES IN THE RANGE OF 0.04-0.49 ng/mL CAN BE SEEN IN:       -UNSTABLE ANGINA       -CONGESTIVE HEART FAILURE       -MYOCARDITIS       -CHEST TRAUMA       -ARRYHTHMIAS       -LATE PRESENTING MYOCARDIAL  INFARCTION       -COPD   CLINICAL FOLLOW-UP RECOMMENDED.   Troponin I-serum (0, 3, 6 hours)     Status: Abnormal   Collection Time: 07/22/15  3:04 AM  Result Value Ref Range   Troponin I 0.09 (H) <0.031 ng/mL    Comment:        PERSISTENTLY INCREASED TROPONIN VALUES IN THE RANGE OF 0.04-0.49 ng/mL CAN BE SEEN IN:       -UNSTABLE ANGINA       -CONGESTIVE HEART FAILURE       -MYOCARDITIS       -CHEST TRAUMA       -ARRYHTHMIAS       -LATE PRESENTING MYOCARDIAL INFARCTION       -COPD   CLINICAL FOLLOW-UP RECOMMENDED.   Lipid panel     Status: None   Collection Time: 07/22/15  3:04 AM  Result Value Ref Range   Cholesterol 152 0 - 200 mg/dL   Triglycerides 112 <150 mg/dL   HDL 61 >40 mg/dL   Total CHOL/HDL Ratio 2.5 RATIO   VLDL 22 0 - 40 mg/dL   LDL Cholesterol 69 0 - 99 mg/dL    Comment:        Total Cholesterol/HDL:CHD Risk Coronary Heart Disease Risk Table  Men   Women  1/2 Average Risk   3.4   3.3  Average Risk       5.0   4.4  2 X Average Risk   9.6   7.1  3 X Average Risk  23.4   11.0        Use the calculated Patient Ratio above and the CHD Risk Table to determine the patient's CHD Risk.        ATP III CLASSIFICATION (LDL):  <100     mg/dL   Optimal  100-129  mg/dL   Near or Above                    Optimal  130-159  mg/dL   Borderline  160-189  mg/dL   High  >190     mg/dL   Very High   Heparin level (unfractionated)     Status: None   Collection Time: 07/22/15  8:40 AM  Result Value Ref Range   Heparin Unfractionated 0.70 0.30 - 0.70 IU/mL    Comment:        IF HEPARIN RESULTS ARE BELOW EXPECTED VALUES, AND PATIENT DOSAGE HAS BEEN CONFIRMED, SUGGEST FOLLOW UP TESTING OF ANTITHROMBIN III LEVELS.   CBC     Status: None   Collection Time: 07/22/15  8:40 AM  Result Value Ref Range   WBC 10.1 4.0 - 10.5 K/uL   RBC 4.10 3.87 - 5.11 MIL/uL   Hemoglobin 13.7 12.0 - 15.0 g/dL   HCT 38.6 36.0 - 46.0 %   MCV 94.1 78.0 - 100.0 fL   MCH  33.4 26.0 - 34.0 pg   MCHC 35.5 30.0 - 36.0 g/dL   RDW 14.9 11.5 - 15.5 %   Platelets 264 150 - 400 K/uL  Heparin level (unfractionated)     Status: Abnormal   Collection Time: 07/22/15  4:00 PM  Result Value Ref Range   Heparin Unfractionated 0.16 (L) 0.30 - 0.70 IU/mL    Comment:        IF HEPARIN RESULTS ARE BELOW EXPECTED VALUES, AND PATIENT DOSAGE HAS BEEN CONFIRMED, SUGGEST FOLLOW UP TESTING OF ANTITHROMBIN III LEVELS.   Heparin level (unfractionated)     Status: None   Collection Time: 07/22/15 11:07 PM  Result Value Ref Range   Heparin Unfractionated 0.51 0.30 - 0.70 IU/mL    Comment:        IF HEPARIN RESULTS ARE BELOW EXPECTED VALUES, AND PATIENT DOSAGE HAS BEEN CONFIRMED, SUGGEST FOLLOW UP TESTING OF ANTITHROMBIN III LEVELS.   Heparin level (unfractionated)     Status: None   Collection Time: 07/23/15  2:24 AM  Result Value Ref Range   Heparin Unfractionated 0.31 0.30 - 0.70 IU/mL    Comment:        IF HEPARIN RESULTS ARE BELOW EXPECTED VALUES, AND PATIENT DOSAGE HAS BEEN CONFIRMED, SUGGEST FOLLOW UP TESTING OF ANTITHROMBIN III LEVELS.   CBC     Status: Abnormal   Collection Time: 07/23/15  2:24 AM  Result Value Ref Range   WBC 12.9 (H) 4.0 - 10.5 K/uL   RBC 3.84 (L) 3.87 - 5.11 MIL/uL   Hemoglobin 13.0 12.0 - 15.0 g/dL   HCT 36.2 36.0 - 46.0 %   MCV 94.3 78.0 - 100.0 fL   MCH 33.9 26.0 - 34.0 pg   MCHC 35.9 30.0 - 36.0 g/dL   RDW 15.4 11.5 - 15.5 %   Platelets 247 150 - 400 K/uL  Protime-INR  Status: Abnormal   Collection Time: 07/23/15  2:24 AM  Result Value Ref Range   Prothrombin Time 15.3 (H) 11.6 - 15.2 seconds   INR 1.19 0.00 - 1.49  POCT Activated clotting time     Status: None   Collection Time: 07/23/15  8:59 AM  Result Value Ref Range   Activated Clotting Time 204 seconds  POCT Activated clotting time     Status: None   Collection Time: 07/23/15  9:11 AM  Result Value Ref Range   Activated Clotting Time 265 seconds     Lipid  Panel     Component Value Date/Time   CHOL 152 07/22/2015 0304   TRIG 112 07/22/2015 0304   HDL 61 07/22/2015 0304   CHOLHDL 2.5 07/22/2015 0304   VLDL 22 07/22/2015 0304   LDLCALC 69 07/22/2015 0304     No results found for: HGBA1C   Lab Results  Component Value Date   LDLCALC 69 07/22/2015   CREATININE 0.87 07/21/2015     HPI :64 y.o. female with past medical history of depression and significant cardiac family history who presented to [facility] ED on 07/21/2015 for new-onset chest pain.  She reports having episodes of chest pressure with exertion for the past week. 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 Over the past week, she develops chest pressure, dyspnea, and diaphoresis with this activity. The pressure is relieved with activity.  Yesterday while sitting in church, she developed the chest pressure again, rating it as a 10/10. Was associated with nausea and diaphoresis. She presented to the ED for this and initially thought this was due to being on prednisone for the past week due to one bursitis.  While admitted, her D-dimer was negative. Cyclic troponin values have been 0.09, 0.10, and 0.09. LDL at 69. CXR with no active cardiopulmonary disease. Her initial EKG showed nonspecific T-wave abnormalities. Repeat EKG this AM shows new TWI in the anterior leads. Reports significant improvement in her pain with SL NTG and NTG ointment.  She has been started on Heparin. Is currently chest pain free.     HOSPITAL COURSE: * Chest pain: acute. Patient with history is suspicious for the possibility of myocardial injury given chest pain and pressure with exertion and now rest. Heart score calculated to be 3 as she has no other family history or cardiac risk factors except for age. Initial EKG shows normal sinus rhythm and troponin abnormal 0.09> 0.10>0.09   telemetry stable Differential includes ACS with NSTEMI  due to plaque rupture versus stress induced cardiomyopathy.  Recommend medical therapy with ASA, metoprolol, statin, and IV heparin and Ntg.   Cardiology performed left heart cath on 12/20, found to have mid LAD lesion, PCI with DES, normal LV function, cardiology recommended Dual antiplatelet therapy for minimum of 3 months. After which time can stop aspirin and continue Brilinta. Change from simvastatin to atorvastatin 80 mg.   Shortness of breath: intermittent with complaints of chest pain. O2 sats remained within normal limits. Chest x-ray showed no acute signs of infiltrate. - Nasal cannula oxygen for shortness of breath symptoms   Depression -continue Celexa    Discharge Exam: *   Blood pressure 158/63, pulse 79, temperature 98.5 F (36.9 C), temperature source Oral, resp. rate 16, height 5' 4"  (1.626 m), weight 65 kg (143 lb 4.8 oz), SpO2 100 %. General: No acute respiratory distress Lungs: Clear to auscultation bilaterally  without wheezes or crackles Cardiovascular: Regular rate and rhythm without murmur gallop or rub normal S1 and S2 Abdomen: Nontender, nondistended, soft, bowel sounds positive, no rebound, no ascites, no appreciable mass Extremities: No significant cyanosis, clubbing, or edema bilateral lower extremities     Follow-up Information    Schedule an appointment as soon as possible for a visit with Reginia Naas, MD.   Specialty:  Family Medicine   Contact information:   [place] [address] [phone]       Follow up with Peter Martinique, MD. Schedule an appointment as soon as possible for a visit in 3 days.   Specialty:  Cardiology   Contact information:   [address] [place] [address] [phone]       Signed: Reyne Dumas 07/23/2015, 11:08 AM        Time spent >45 mins

## 2015-07-24 ENCOUNTER — Encounter (HOSPITAL_COMMUNITY): Payer: Self-pay | Admitting: Student

## 2015-07-24 ENCOUNTER — Inpatient Hospital Stay (HOSPITAL_COMMUNITY): Payer: 59

## 2015-07-24 DIAGNOSIS — I97630 Postprocedural hematoma of a circulatory system organ or structure following a cardiac catheterization: Secondary | ICD-10-CM

## 2015-07-24 DIAGNOSIS — I214 Non-ST elevation (NSTEMI) myocardial infarction: Secondary | ICD-10-CM | POA: Diagnosis present

## 2015-07-24 LAB — BASIC METABOLIC PANEL
Anion gap: 6 (ref 5–15)
BUN: 14 mg/dL (ref 6–20)
CHLORIDE: 107 mmol/L (ref 101–111)
CO2: 26 mmol/L (ref 22–32)
CREATININE: 0.82 mg/dL (ref 0.44–1.00)
Calcium: 8.4 mg/dL — ABNORMAL LOW (ref 8.9–10.3)
GFR calc Af Amer: >60 mL/min (ref >60)
GFR calc non Af Amer: >60 mL/min (ref >60)
GLUCOSE: 111 mg/dL — AB (ref 65–99)
POTASSIUM: 4.5 mmol/L (ref 3.5–5.1)
Sodium: 139 mmol/L (ref 135–145)

## 2015-07-24 LAB — CBC
HEMATOCRIT: 32.8 % — AB (ref 36.0–46.0)
Hemoglobin: 11.6 g/dL — ABNORMAL LOW (ref 12.0–15.0)
MCH: 33.2 pg (ref 26.0–34.0)
MCHC: 35.4 g/dL (ref 30.0–36.0)
MCV: 94 fL (ref 78.0–100.0)
PLATELETS: 231 10*3/uL (ref 150–400)
RBC: 3.49 MIL/uL — ABNORMAL LOW (ref 3.87–5.11)
RDW: 15.6 % — AB (ref 11.5–15.5)
WBC: 11.9 10*3/uL — AB (ref 4.0–10.5)

## 2015-07-24 MED ORDER — NITROGLYCERIN 0.4 MG SL SUBL: 0.4000 mg | SUBLINGUAL_TABLET | SUBLINGUAL | Status: DC | PRN

## 2015-07-24 MED ORDER — TICAGRELOR 90 MG PO TABS: 90.0000 mg | ORAL_TABLET | Freq: Two times a day (BID) | ORAL | Status: DC

## 2015-07-24 NOTE — Progress Notes (Signed)
CARDIAC REHAB PHASE I   PRE:  Rate/Rhythm: 84 SR  BP:  Supine: 112/50  Sitting:   Standing:    SaO2:   MODE:  Ambulation: 500 ft   POST:  Rate/Rhythm: 87 SR  BP:  Supine:   Sitting: 123/50  Standing:    SaO2:  0950-1050 Pt walked 500 ft with steady gait. No CP. Right groin sore. MI education completed with pt who voiced understanding. Stressed importance of brilinta with stent. Discussed CRP 2 and referring to [facility] program. Tolerated walk well. Right groin bruised.   Graylon Good, RN BSN  07/24/2015 10:47 AM

## 2015-07-24 NOTE — Progress Notes (Signed)
Pt developed small hematoma post ambulation,rt groin  pressure held , hematoma resolved, site evaluated by Lesia Hausen PA.

## 2015-07-24 NOTE — Progress Notes (Signed)
  Called for the patient having a small hematoma following her walk with Cardiac Rehab. This is the third time it has occurred since her catheterization yesterday (occurring every time she stands up). No bruit is appreciated on exam.  Obtaining STAT arterial US of the right groin.  Signed, Erma Heritage, PA-C 07/24/2015, 11:54 AM Pager: [phone]

## 2015-07-24 NOTE — Research (Signed)
[facility] Research Study Informed Consent   Subject Name: Kaitlyn Lozano  Subject met inclusion and exclusion criteria.  The informed consent form, study requirements and expectations were reviewed with the subject and questions and concerns were addressed prior to the signing of the consent form.  The subject verbalized understanding of the trail requirements.  The subject agreed to participate in the [place] trial and signed the informed consent.  The informed consent was obtained prior to performance of any protocol-specific procedures for the subject.  A copy of the signed informed consent was given to the subject and a copy was placed in the subject's medical record.  [name] 07/24/2015, 09:00am

## 2015-07-24 NOTE — Discharge Summary (Signed)
Physician Discharge Summary  ALYCIA COOPERWOOD MRN: [id number] DOB/AGE: 1951-03-16 64 y.o.  PCP: Reginia Naas, MD   Admit date: 07/21/2015 Discharge date: 07/24/2015  Discharge Diagnoses:     Principal Problem:   NSTEMI (non-ST elevated myocardial infarction) [facility]) Active Problems:   Chest pain   Depression   Shortness of breath   Acute coronary syndrome [facility])   Family history of premature coronary artery disease   Abnormal finding on EKG   Atherosclerosis of native coronary artery of native heart with unstable angina pectoris ([place])   Addendum-discussed with Dr. Ellyn Hack Patient to be discharged home tomorrow as she received a stent to her LAD  Follow-up recommendations Follow-up with PCP in 3-5 days , including all  additional recommended appointments as below Follow-up CBC, CMP in 3-5 days      Medication List    TAKE these medications        acetaminophen 650 MG CR tablet  Commonly known as:  TYLENOL  Take 1,300 mg by mouth at bedtime as needed for pain.     aspirin 81 MG chewable tablet  Chew 1 tablet (81 mg total) by mouth daily.     atorvastatin 80 MG tablet  Commonly known as:  LIPITOR  Take 1 tablet (80 mg total) by mouth daily at 6 PM.     citalopram 20 MG tablet  Commonly known as:  CELEXA  Take 20 mg by mouth daily.     CITRACAL + D PO  Take 1 tablet by mouth 2 (two) times daily.     metoprolol tartrate 25 MG tablet  Commonly known as:  LOPRESSOR  Take 0.5 tablets (12.5 mg total) by mouth 2 (two) times daily.     multivitamin with minerals tablet  Take 1 tablet by mouth daily. Centrum Silver     nitroGLYCERIN 0.4 MG SL tablet  Commonly known as:  NITROSTAT  Place 1 tablet (0.4 mg total) under the tongue every 5 (five) minutes as needed for chest pain.     ticagrelor 90 MG Tabs tablet  Commonly known as:  BRILINTA  Take 1 tablet (90 mg total) by mouth 2 (two) times daily.     Turmeric 500 MG Caps  Take 500 mg by mouth daily.      vitamin B-12 1000 MCG tablet  Commonly known as:  CYANOCOBALAMIN  Take 1,000 mcg by mouth daily.     vitamin C 500 MG tablet  Commonly known as:  ASCORBIC ACID  Take 500 mg by mouth daily.     VITAMIN D PO  Take 1 tablet by mouth daily.         Discharge Condition: *Stable   Discharge Instructions   Discharge Instructions    Diet - low sodium heart healthy    Complete by:  As directed      Diet - low sodium heart healthy    Complete by:  As directed      Increase activity slowly    Complete by:  As directed      Increase activity slowly    Complete by:  As directed            Allergies  Allergen Reactions  . Sulfa Antibiotics Rash  . Sulfonamide Derivatives Rash      Disposition: 01-Home or Self Care   Consults:  Cardiology     Significant Diagnostic Studies:  Dg Chest 2 View  07/21/2015  CLINICAL DATA:  Chest pain radiating into the throat. EXAM: CHEST  2 VIEW COMPARISON:  None. FINDINGS: Normal heart size. Lungs are hyperaerated and clear. No pneumothorax or pleural effusion. IMPRESSION: No active cardiopulmonary disease. Electronically Signed   By: Marybelle Killings M.D.   On: 07/21/2015 13:16     2-D echo LV EF: 60% -  65%  ------------------------------------------------------------------- Indications:   Chest pain 786.51.  ------------------------------------------------------------------- History:  PMH:  Dyspnea.  ------------------------------------------------------------------- Study Conclusions  - Left ventricle: The cavity size was normal. Systolic function was normal. The estimated ejection fraction was in the range of 60% to 65%. Wall motion was normal; there were no regional wall motion abnormalities. - Atrial septum: No defect or patent foramen ovale was identified.  Cardiac cath  Mid LAD lesion, 95% stenosed. PCI with Synergy DES 2.5 mm x 16 mm (2.75 mm). Post intervention, there is a 0% residual  stenosis.  Bifurcation OM lesions: 1st Mrg lesion, 60% stenosed. 2nd Mrg lesion, 55% stenosed.  The left ventricular systolic function is normal.   Successful PCI of the most significant culprit lesion in the LAD. There are moderate lesions in the circumflex branches that will be treated medically.  Plan:  Standard Femoral and radial cath care.  Dual antiplatelet therapy for minimum of 3 months. After which time can stop aspirin and continue Brilinta.  Change from simvastatin to atorvastatin 80 mg.  Anticipate discharge tomorrow.   Filed Weights   07/21/15 1228 07/23/15 0646 07/24/15 0457  Weight: 63.504 kg (140 lb) 65 kg (143 lb 4.8 oz) 65.7 kg (144 lb 13.5 oz)     Microbiology: No results found for this or any previous visit (from the past 240 hour(s)).     Blood Culture No results found for: SDES, Jeannette, CULT, REPTSTATUS    Labs: Results for orders placed or performed during the hospital encounter of 07/21/15 (from the past 48 hour(s))  Heparin level (unfractionated)     Status: Abnormal   Collection Time: 07/22/15  4:00 PM  Result Value Ref Range   Heparin Unfractionated 0.16 (L) 0.30 - 0.70 IU/mL    Comment:        IF HEPARIN RESULTS ARE BELOW EXPECTED VALUES, AND PATIENT DOSAGE HAS BEEN CONFIRMED, SUGGEST FOLLOW UP TESTING OF ANTITHROMBIN III LEVELS.   Heparin level (unfractionated)     Status: None   Collection Time: 07/22/15 11:07 PM  Result Value Ref Range   Heparin Unfractionated 0.51 0.30 - 0.70 IU/mL    Comment:        IF HEPARIN RESULTS ARE BELOW EXPECTED VALUES, AND PATIENT DOSAGE HAS BEEN CONFIRMED, SUGGEST FOLLOW UP TESTING OF ANTITHROMBIN III LEVELS.   Heparin level (unfractionated)     Status: None   Collection Time: 07/23/15  2:24 AM  Result Value Ref Range   Heparin Unfractionated 0.31 0.30 - 0.70 IU/mL    Comment:        IF HEPARIN RESULTS ARE BELOW EXPECTED VALUES, AND PATIENT DOSAGE HAS BEEN CONFIRMED, SUGGEST FOLLOW UP  TESTING OF ANTITHROMBIN III LEVELS.   CBC     Status: Abnormal   Collection Time: 07/23/15  2:24 AM  Result Value Ref Range   WBC 12.9 (H) 4.0 - 10.5 K/uL   RBC 3.84 (L) 3.87 - 5.11 MIL/uL   Hemoglobin 13.0 12.0 - 15.0 g/dL   HCT 36.2 36.0 - 46.0 %   MCV 94.3 78.0 - 100.0 fL   MCH 33.9 26.0 - 34.0 pg   MCHC 35.9 30.0 - 36.0 g/dL   RDW 15.4 11.5 - 15.5 %  Platelets 247 150 - 400 K/uL  Protime-INR     Status: Abnormal   Collection Time: 07/23/15  2:24 AM  Result Value Ref Range   Prothrombin Time 15.3 (H) 11.6 - 15.2 seconds   INR 1.19 0.00 - 1.49  POCT Activated clotting time     Status: None   Collection Time: 07/23/15  8:59 AM  Result Value Ref Range   Activated Clotting Time 204 seconds  POCT Activated clotting time     Status: None   Collection Time: 07/23/15  9:11 AM  Result Value Ref Range   Activated Clotting Time 265 seconds  POCT Activated clotting time     Status: None   Collection Time: 07/23/15 11:25 AM  Result Value Ref Range   Activated Clotting Time 204 seconds  POCT Activated clotting time     Status: None   Collection Time: 07/23/15  2:25 PM  Result Value Ref Range   Activated Clotting Time 152 seconds  Basic metabolic panel     Status: Abnormal   Collection Time: 07/24/15  4:14 AM  Result Value Ref Range   Sodium 139 135 - 145 mmol/L   Potassium 4.5 3.5 - 5.1 mmol/L   Chloride 107 101 - 111 mmol/L   CO2 26 22 - 32 mmol/L   Glucose, Bld 111 (H) 65 - 99 mg/dL   BUN 14 6 - 20 mg/dL   Creatinine, Ser 0.82 0.44 - 1.00 mg/dL   Calcium 8.4 (L) 8.9 - 10.3 mg/dL   GFR calc non Af Amer >60 >60 mL/min   GFR calc Af Amer >60 >60 mL/min    Comment: (NOTE) The eGFR has been calculated using the CKD EPI equation. This calculation has not been validated in all clinical situations. eGFR's persistently <60 mL/min signify possible Chronic Kidney Disease.    Anion gap 6 5 - 15  CBC     Status: Abnormal   Collection Time: 07/24/15  4:14 AM  Result Value Ref  Range   WBC 11.9 (H) 4.0 - 10.5 K/uL   RBC 3.49 (L) 3.87 - 5.11 MIL/uL   Hemoglobin 11.6 (L) 12.0 - 15.0 g/dL   HCT 32.8 (L) 36.0 - 46.0 %   MCV 94.0 78.0 - 100.0 fL   MCH 33.2 26.0 - 34.0 pg   MCHC 35.4 30.0 - 36.0 g/dL   RDW 15.6 (H) 11.5 - 15.5 %   Platelets 231 150 - 400 K/uL     Lipid Panel     Component Value Date/Time   CHOL 152 07/22/2015 0304   TRIG 112 07/22/2015 0304   HDL 61 07/22/2015 0304   CHOLHDL 2.5 07/22/2015 0304   VLDL 22 07/22/2015 0304   LDLCALC 69 07/22/2015 0304     No results found for: HGBA1C   Lab Results  Component Value Date   LDLCALC 69 07/22/2015   CREATININE 0.82 07/24/2015     HPI :64 y.o. female with past medical history of depression and significant cardiac family history who presented to [facility] ED on 07/21/2015 for new-onset chest pain.  She reports having episodes of chest pressure with exertion for the past week. 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 Over the past week, she develops chest pressure, dyspnea, and diaphoresis with this activity. The pressure is relieved with activity.  Yesterday while sitting in church, she developed the chest pressure again, rating it as a 10/10. Was associated  with nausea and diaphoresis. She presented to the ED for this and initially thought this was due to being on prednisone for the past week due to one bursitis.  While admitted, her D-dimer was negative. Cyclic troponin values have been 0.09, 0.10, and 0.09. LDL at 69. CXR with no active cardiopulmonary disease. Her initial EKG showed nonspecific T-wave abnormalities. Repeat EKG this AM shows new TWI in the anterior leads. Reports significant improvement in her pain with SL NTG and NTG ointment.  She has been started on Heparin. Is currently chest pain free.     HOSPITAL COURSE: * N-STEMI;  Chest pain: acute. Patient with history is suspicious for the possibility of  myocardial injury given chest pain and pressure with exertion and now rest. Heart score calculated to be 3 as she has no other family history or cardiac risk factors except for age. Initial EKG shows normal sinus rhythm and troponin abnormal 0.09> 0.10>0.09   telemetry stable Differential includes ACS with NSTEMI due to plaque rupture versus stress induced cardiomyopathy.  Received ASA, metoprolol, statin, and IV heparin and Ntg.  Cardiology performed left heart cath on 12/20, found to have mid LAD lesion, PCI with DES, normal LV function, cardiology recommended Dual antiplatelet therapy for minimum of 3 months. After which time can stop aspirin and continue Brilinta. Change from simvastatin to atorvastatin 80 mg. Denies chest pain, ready to go home Small hematoma management per cardiology   Shortness of breath: intermittent with complaints of chest pain. O2 sats remained within normal limits. Chest x-ray showed no acute signs of infiltrate. - Nasal cannula oxygen for shortness of breath symptoms  -resolved.   Depression -continue Celexa    Discharge Exam: *   Blood pressure 98/48, pulse 86, temperature 97.7 F (36.5 C), temperature source Oral, resp. rate 14, height _0  (1.626 m), weight 65.7 kg (144 lb 13.5 oz), SpO2 100 %. General: No acute respiratory distress Lungs: Clear to auscultation bilaterally without wheezes or crackles Cardiovascular: Regular rate and rhythm without murmur gallop or rub normal S1 and S2 Abdomen: Nontender, nondistended, soft, bowel sounds positive, no rebound, no ascites, no appreciable mass Extremities: No significant cyanosis, clubbing, or edema bilateral lower extremities     Follow-up Information    Schedule an appointment as soon as possible for a visit with Reginia Naas, MD.   Specialty:  Family Medicine   Contact information:   [address] [place] [address] [phone]       Follow up with [facility] On 08/06/2015.   Specialty:  Cardiology   Why:  Cardiology Hospital Follow-Up on 08/06/2015 at 9:30AM with Rosaria Ferries, PA-C. [facility] Location)   Contact information:   [address] [place] [phone]      Follow up with Reginia Naas, MD In 1 week.   Specialty:  Family Medicine   Contact information:   [place] [address] [phone]       Signed: Elmarie Shiley 07/24/2015, 10:40 AM        Time spent >45 mins

## 2015-07-24 NOTE — Progress Notes (Signed)
Patient Name: Kaitlyn Lozano Date of Encounter: 07/24/2015  Principal Problem:   Acute coronary syndrome [facility]) Active Problems:   Chest pain   Depression   Shortness of breath   Family history of premature coronary artery disease   Abnormal finding on EKG   Atherosclerosis of native coronary artery of native heart with unstable angina pectoris [facility])    Primary Cardiologist: New - Dr. Martin[place] Patient Profile: 64 y.o. F w/ PMH of depression and significant cardiac family history who presented to [facility] ED on 07/21/2015 for new-onset chest pain. LHC on 07/23/2015 showed 95% stenosis of the Mid-LAD. PCI performed with DES.  SUBJECTIVE: Reports episode of chest pain overnight which resolved in a few minutes. She also developed a small groin hematoma last night after initially standing up. This was reduced but it returned this morning and was again reducible.  OBJECTIVE Filed Vitals:   07/23/15 1624 07/23/15 1946 07/23/15 2000 07/24/15 0457  BP: 99/82 128/52  117/54  Pulse: 84 80 79 76  Temp: 98.1 F (36.7 C) 98.3 F (36.8 C)  97.9 F (36.6 C)  TempSrc: Oral Oral  Oral  Resp: 16 14 15 13   Height:      Weight:    144 lb 13.5 oz (65.7 kg)  SpO2: 98% 100% 100% 100%    Intake/Output Summary (Last 24 hours) at 07/24/15 0731 Last data filed at 07/24/15 [id number]  Gross per 24 hour  Intake  747.5 ml  Output   3100 ml  Net -2352.5 ml   Filed Weights   07/21/15 1228 07/23/15 0646 07/24/15 0457  Weight: 140 lb (63.504 kg) 143 lb 4.8 oz (65 kg) 144 lb 13.5 oz (65.7 kg)    PHYSICAL EXAM General: Well developed, well nourished, female in no acute distress. Head: Normocephalic, atraumatic.  Neck: Supple without bruits, JVD not elevated. Lungs:  Resp regular and unlabored, CTA without wheezing or rales. Heart: RRR, S1, S2, no S3, S4, or murmur; no rub. Abdomen: Soft, non-tender, non-distended with normoactive bowel sounds. No hepatomegaly. No rebound/guarding. No obvious  abdominal masses. Extremities: No clubbing, cyanosis, or edema. Distal pedal pulses are 2+ bilaterally. She has a large ecchymosis along her right groin. Small hematoma (actively being reduced by patient's nurse). Neuro: Alert and oriented X 3. Moves all extremities spontaneously. Psych: Normal affect.   LABS: CBC: Recent Labs  07/23/15 0224 07/24/15 0414  WBC 12.9* 11.9*  HGB 13.0 11.6*  HCT 36.2 32.8*  MCV 94.3 94.0  PLT 247 231   INR: Recent Labs  07/23/15 0224  INR 0000000   Basic Metabolic Panel: Recent Labs  07/21/15 1228 07/24/15 0414  NA 138 139  K 4.5 4.5  CL 102 107  CO2 26 26  GLUCOSE 86 111*  BUN 15 14  CREATININE 0.87 0.82  CALCIUM 9.4 8.4*   Cardiac Enzymes: Recent Labs  07/21/15 2142 07/22/15 0025 07/22/15 0304  TROPONINI 0.09* 0.10* 0.09*    Recent Labs  07/21/15 1234 07/21/15 2005  TROPIPOC 0.01 0.01   D-dimer: Recent Labs  07/21/15 1730  DDIMER 0.33   Fasting Lipid Panel: Recent Labs  07/22/15 0304  CHOL 152  HDL 61  LDLCALC 69  TRIG 112  CHOLHDL 2.5    TELE:  NSR with rate in 70's - 80's. No acute events.      Cardiac Catheterization: 07/23/2015  1. Mid LAD lesion, 95% stenosed. PCI with Synergy DES 2.5 mm x 16 mm (2.75 mm). Post intervention, there  is a 0% residual stenosis. 2. Bifurcation OM lesions: 1st Mrg lesion, 60% stenosed. 2nd Mrg lesion, 55% stenosed. 3. The left ventricular systolic function is normal.   Successful PCI of the most significant culprit lesion in the LAD. There are moderate lesions in the circumflex branches that will be treated medically.  Plan:  Standard Femoral and radial cath care.  Dual antiplatelet therapy for minimum of 3 months. After which time can stop aspirin and continue Brilinta.  Change from simvastatin to atorvastatin 80 mg.  Anticipate discharge tomorrow.   ECHO:07/22/2015 Study Conclusions - Left ventricle: The cavity size was normal. Systolic function was normal.  The estimated ejection fraction was in the range of 60% to 65%. Wall motion was normal; there were no regional wall motion abnormalities. - Atrial septum: No defect or patent foramen ovale was identified.    Current Medications:  . aspirin  81 mg Oral Daily  . atorvastatin  80 mg Oral q1800  . cholecalciferol  1,000 Units Oral Daily  . citalopram  20 mg Oral Daily  . metoprolol tartrate  12.5 mg Oral BID  .  morphine injection  4 mg Intravenous Once  . multivitamin with minerals  1 tablet Oral Daily  . sodium chloride  3 mL Intravenous Q12H  . ticagrelor  90 mg Oral BID  . vitamin B-12  1,000 mcg Oral Daily  . vitamin C  500 mg Oral Daily      ASSESSMENT AND PLAN:  1. NSTEMI - presents with episodes of chest pressure, dyspnea, and diaphoresis with physical exertion over the past week. Has been relieved with SL NTG. - cyclic troponin values have been 0.09, 0.10, and 0.09. Lipid panel checked and LDL at 69. EKG showed TWI in the anterior leads.  - LHC on 07/23/2015 showed 55% stenosis 2nd Mrg, 60% stenosis 1st Mrg, and  95% stenosis in Mid LAD. PCI with Synergy DES 2.5 mm x 16 mm (2.75 mm) was performed on the LAD. - Will need to be on ASA and Brilinta for a minimum of 3 months, after which time she can stop ASA and continue Brilinta. - Continue BB and statin. BP has been low in the 80's - 90's at times. Consider addition of low-dose ACE-I at follow-up if BP will allow. - has a small right groin hematoma which has been reduced twice. Will continue to monitor.   Arna Medici , PA-C 7:31 AM 07/24/2015 Pager: [phone] Patient seen and examined and history reviewed. Agree with above findings and plan. No chest pain. Had a hematoma in right groin last night with initial ambulation. This am developed a knot when up to BR. Reduced manually. Now groin soft without bruit. There is a moderate amount of bruising. Labs stable. BP soft so will continue low dose metoprolol  only. On statin. Enrolled in TWILIGHT trial with ASA/Brilinta. Encourage outpatient Rehab. Plan DC today. 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 Would like to use her accrued sick leave and I think this is fine.  Undrea Archbold [place], [place] 07/24/2015 9:11 AM

## 2015-07-24 NOTE — Progress Notes (Signed)
Preliminary results by tech - Right Groin Duplex Limited study completed. No evidence of pseudoaneurysm or AVF noted in the right groin. Oda Cogan, BS, RDMS, RVT

## 2015-07-24 NOTE — Progress Notes (Signed)
Pt rt groin site stable , level 1 with  bruising,  no new  hematoma formation post ambulation noted at this time.  V/s stable , denies any discomforts.Kaitlyn Lozano for discharge by Lesia Hausen PA

## 2015-07-24 NOTE — Discharge Instructions (Signed)

## 2015-07-25 ENCOUNTER — Other Ambulatory Visit: Payer: Self-pay | Admitting: *Deleted

## 2015-07-25 MED ORDER — AMBULATORY NON FORMULARY MEDICATION: 81.0000 mg | Freq: Every day | Status: DC

## 2015-07-25 MED ORDER — AMBULATORY NON FORMULARY MEDICATION: 90.0000 mg | Freq: Two times a day (BID) | Status: DC

## 2015-07-26 ENCOUNTER — Ambulatory Visit (INDEPENDENT_AMBULATORY_CARE_PROVIDER_SITE_OTHER): Payer: 59 | Admitting: Nurse Practitioner

## 2015-07-26 ENCOUNTER — Other Ambulatory Visit: Payer: Self-pay | Admitting: Nurse Practitioner

## 2015-07-26 ENCOUNTER — Encounter: Payer: Self-pay | Admitting: Nurse Practitioner

## 2015-07-26 ENCOUNTER — Telehealth: Payer: Self-pay | Admitting: Cardiology

## 2015-07-26 ENCOUNTER — Ambulatory Visit (HOSPITAL_COMMUNITY)
Admission: RE | Admit: 2015-07-26 | Discharge: 2015-07-26 | Disposition: A | Discharge status: Home / Self Care | Payer: 59 | Source: Ambulatory Visit | Attending: Cardiovascular Disease | Admitting: Cardiovascular Disease

## 2015-07-26 VITALS — BP 100/70 | HR 80 | Ht 64.0 in | Wt 147.0 lb

## 2015-07-26 DIAGNOSIS — T148XXA Other injury of unspecified body region, initial encounter: Secondary | ICD-10-CM

## 2015-07-26 DIAGNOSIS — R1909 Other intra-abdominal and pelvic swelling, mass and lump: Secondary | ICD-10-CM | POA: Insufficient documentation

## 2015-07-26 DIAGNOSIS — Z955 Presence of coronary angioplasty implant and graft: Secondary | ICD-10-CM

## 2015-07-26 DIAGNOSIS — T148 Other injury of unspecified body region: Secondary | ICD-10-CM | POA: Insufficient documentation

## 2015-07-26 DIAGNOSIS — E785 Hyperlipidemia, unspecified: Secondary | ICD-10-CM

## 2015-07-26 DIAGNOSIS — X58XXXA Exposure to other specified factors, initial encounter: Secondary | ICD-10-CM | POA: Insufficient documentation

## 2015-07-26 DIAGNOSIS — I214 Non-ST elevation (NSTEMI) myocardial infarction: Secondary | ICD-10-CM | POA: Diagnosis not present

## 2015-07-26 NOTE — Patient Instructions (Addendum)
We will be checking the following labs today - NONE   Medication Instructions:    Continue with your current medicines.     Testing/Procedures To Be Arranged:  Duplex study of the right groin - if this is ok - use ice as tolerated and limit your activity - you can apply gentle manual pressure as well.   Follow-Up:   See Rosaria Ferries, PA as planned in January.     Other Special Instructions:   N/A    If you need a refill on your cardiac medications before your next appointment, please call your pharmacy.   Call the [place] office at [phone] if you have any questions, problems or concerns.

## 2015-07-26 NOTE — Telephone Encounter (Signed)
Pt called after going several places today for her groin she developed chest heaviness and "lump in her throat" she took total of 2 NTG with relief though still with lump in her throat- I discussed with Dr. Martinique and we believe this is the brilinta if reoccurs she will try cola and NTG.  If increases see will call or come to ER.

## 2015-07-26 NOTE — Progress Notes (Signed)
CARDIOLOGY OFFICE NOTE  Date:  07/26/2015    [name] Date of Birth: 01/24/1951 Medical Record [id number]  PCP:  Reginia Naas, MD  Cardiologist:  Martinique (NEW)    Chief Complaint  Patient presents with  . Coronary Artery Disease    Post MI/PCI - seen for Dr. Martinique    History of Present Illness: Kaitlyn Lozano is a 64 y.o. female who presents today for a post hospital visit. Seen for Dr. Martinique. She has a history of depression and significant cardiac family history.   She presented to [facility] ED on 07/21/2015 for new-onset chest pain. LHC on 07/23/2015 showed 95% stenosis of the Mid-LAD. PCI performed with DES. Did have a hematoma that had to be reduced manually twice. Discharged just yesterday.   Comes back today. Here alone. Very early post hospital visit. Had unsuccessful attempt at cath thru her wrist. Then went thru the femoral - had hematoma - had to be reduced twice manually after just minimal activity - she tells me this happened 3 times. She walked yesterday - just 5 minutes and was up and down during the course of the day and did ok. By last evening, groin was hurting - has "another hard spot" - laid down - did not apply pressure. Still with discomfort and swelling. No more chest pain. Has had some vague heaviness in her chest and a lump in her throat - this happened twice while admitted - treated with coke. Has only had one spell since discharged and drank coke with relief. She was told this was from her Brilinta but notes this is vaguely reminiscent of her chest pain syndrome. No NTG use.   Past Medical History  Diagnosis Date  . Depression   . Depression   . Bronchitis     eosinophilic  . GERD (gastroesophageal reflux disease)   . Arthritis     OA  . CAD (coronary artery disease)     a. 07/2015: 95% stenosis of LAD --> DES placed, 60% stenosis 1st Mrg, 55% stenosis 2nd Mrg    Past Surgical History  Procedure Laterality Date  . Dnc    .  Dilation and curettage of uterus  1993-1994  . Hernia repair    . Cardiac catheterization N/A 07/23/2015    Procedure: Left Heart Cath and Coronary Angiography;  Surgeon: Leonie Man, MD;  Location: [facility];  Service: Cardiovascular;  Laterality: N/A;  . Cardiac catheterization N/A 07/23/2015    Procedure: Coronary Stent Intervention;  Surgeon: Leonie Man, MD;  Location: [facility];  Service: Cardiovascular;  Laterality: N/A;     Medications: Current Outpatient Prescriptions  Medication Sig Dispense Refill  . acetaminophen (TYLENOL) 650 MG CR tablet Take 1,300 mg by mouth at bedtime as needed for pain.    Marland Kitchen AMBULATORY NON FORMULARY MEDICATION Take 90 mg by mouth 2 (two) times daily. Medication Name: BRILINTA 90 mg BID (TWILIGHT Research study provided do not fill)    . AMBULATORY NON FORMULARY MEDICATION Take 81 mg by mouth daily. Medication Name: ASA 81 mg Daily ( TWILIGHT research study provided)    . atorvastatin (LIPITOR) 80 MG tablet Take 1 tablet (80 mg total) by mouth daily at 6 PM. 30 tablet 6  . Calcium Citrate-Vitamin D (CITRACAL + D PO) Take 1 tablet by mouth 2 (two) times daily.    . Cholecalciferol (VITAMIN D PO) Take 1 tablet by mouth daily.    Marland Kitchen  citalopram (CELEXA) 20 MG tablet Take 20 mg by mouth daily.     . metoprolol tartrate (LOPRESSOR) 25 MG tablet Take 0.5 tablets (12.5 mg total) by mouth 2 (two) times daily. 60 tablet 1  . Multiple Vitamins-Minerals (MULTIVITAMIN WITH MINERALS) tablet Take 1 tablet by mouth daily. Centrum Silver    . nitroGLYCERIN (NITROSTAT) 0.4 MG SL tablet Place 1 tablet (0.4 mg total) under the tongue every 5 (five) minutes as needed for chest pain. 20 tablet 0  . Turmeric 500 MG CAPS Take 500 mg by mouth daily.    . vitamin B-12 (CYANOCOBALAMIN) 1000 MCG tablet Take 1,000 mcg by mouth daily.    . vitamin C (ASCORBIC ACID) 500 MG tablet Take 500 mg by mouth daily.     No current facility-administered medications for  this visit.    Allergies: Allergies  Allergen Reactions  . Sulfa Antibiotics Rash  . Sulfonamide Derivatives Rash    Social History: The patient  reports that she has never smoked. She has never used smokeless tobacco. She reports that she drinks alcohol. She reports that she does not use illicit drugs.   Family History: The patient's family history includes Aortic stenosis in her father; Atrial fibrillation in her mother; Heart attack in her maternal grandfather; Heart failure in her maternal grandmother and mother; Hypertension in her mother; Stroke in her mother; Transient ischemic attack in her mother.   Review of Systems: Please see the history of present illness.   Otherwise, the review of systems is positive for none.   All other systems are reviewed and negative.   Physical Exam: VS:  BP 100/70 mmHg  Pulse 80  Ht 5\' 4"  (1.626 m)  Wt 147 lb (66.679 kg)  BMI 25.22 kg/m2  SpO2 98% .  BMI Body mass index is 25.22 kg/(m^2).  Wt Readings from Last 3 Encounters:  07/26/15 147 lb (66.679 kg)  07/24/15 144 lb 13.5 oz (65.7 kg)  07/04/14 146 lb (66.225 kg)    General: Pleasant. Well developed, well nourished and in no acute distress.  HEENT: Normal. Neck: Supple, no JVD, carotid bruits, or masses noted.  Cardiac: Regular rate and rhythm. No murmurs, rubs, or gallops. No edema.  Respiratory:  Lungs are clear to auscultation bilaterally with normal work of breathing.  GI: Soft and nontender.  MS: No deformity or atrophy. Gait and ROM intact. Skin: Warm and dry. Color is normal.  Neuro:  Strength and sensation are intact and no gross focal deficits noted.  Psych: Alert, appropriate and with normal affect.  Right groin with diffuse ecchymosis and a 50 cent size knot - I cannot appreciate a bruit. It is tender to touch.    LABORATORY DATA:  EKG:  EKG is not ordered today.  Lab Results  Component Value Date   WBC 11.9* 07/24/2015   HGB 11.6* 07/24/2015   HCT 32.8*  07/24/2015   PLT 231 07/24/2015   GLUCOSE 111* 07/24/2015   CHOL 152 07/22/2015   TRIG 112 07/22/2015   HDL 61 07/22/2015   LDLCALC 69 07/22/2015   NA 139 07/24/2015   K 4.5 07/24/2015   CL 107 07/24/2015   CREATININE 0.82 07/24/2015   BUN 14 07/24/2015   CO2 26 07/24/2015   INR 1.19 07/23/2015    BNP (last 3 results) No results for input(s): BNP in the last 8760 hours.  ProBNP (last 3 results) No results for input(s): PROBNP in the last 8760 hours.   Other Studies Reviewed Today:  Cardiac Catheterization: 07/23/2015  1. Mid LAD lesion, 95% stenosed. PCI with Synergy DES 2.5 mm x 16 mm (2.75 mm). Post intervention, there is a 0% residual stenosis. 2. Bifurcation OM lesions: 1st Mrg lesion, 60% stenosed. 2nd Mrg lesion, 55% stenosed. 3. The left ventricular systolic function is normal.   Successful PCI of the most significant culprit lesion in the LAD. There are moderate lesions in the circumflex branches that will be treated medically.  Plan:  Standard Femoral and radial cath care.  Dual antiplatelet therapy for minimum of 3 months. After which time can stop aspirin and continue Brilinta.  Change from simvastatin to atorvastatin 80 mg.  Anticipate discharge tomorrow.   ECHO:07/22/2015 Study Conclusions - Left ventricle: The cavity size was normal. Systolic function was normal. The estimated ejection fraction was in the range of 60% to 65%. Wall motion was normal; there were no regional wall motion abnormalities. - Atrial septum: No defect or patent foramen ovale was identified.  Assessment/Plan: 1. NSTEMI - s/p PCI to the mid LAD with DES - she is in the TWILIGHT study -   - Will need to be on ASA and Brilinta for a minimum of 3 months, after which time she can stop ASA and continue Brilinta.   Has only been home less than 48 hours.  EF is normal. Initially doing ok but now with more groin issues - will get repeat duplex study. If ok, suggested to use  ice as tolerated, restrict activities and apply gentle manual pressure.   2. Hematoma - manually reduced twice - she says three times - repeating duplex study.  3.  HLD - on statin therapy  4. Chest heaviness - attributed to Brilinta - improves with drinking Coke. She is to let us know if this persists/worsens. She has NTG on hand.   Current medicines are reviewed with the patient today.  The patient does not have concerns regarding medicines other than what has been noted above.  The following changes have been made:  See above.  Labs/ tests ordered today include:   No orders of the defined types were placed in this encounter.     Disposition:   FU with Rosaria Ferries, PA as planned.   Patient is agreeable to this plan and will call if any problems develop in the interim.   Signed: Burtis Junes, RN, ANP-C 07/26/2015 10:37 AM  [facility] [address] [place], [place]  [address] Phone: [phone] Fax: [phone]

## 2015-08-06 ENCOUNTER — Encounter: Payer: Self-pay | Admitting: Physician Assistant

## 2015-08-06 ENCOUNTER — Ambulatory Visit (INDEPENDENT_AMBULATORY_CARE_PROVIDER_SITE_OTHER): Payer: 59 | Admitting: Physician Assistant

## 2015-08-06 VITALS — BP 96/56 | HR 72 | Ht 64.0 in | Wt 147.1 lb

## 2015-08-06 DIAGNOSIS — Z79899 Other long term (current) drug therapy: Secondary | ICD-10-CM

## 2015-08-06 DIAGNOSIS — I214 Non-ST elevation (NSTEMI) myocardial infarction: Secondary | ICD-10-CM | POA: Diagnosis not present

## 2015-08-06 DIAGNOSIS — E785 Hyperlipidemia, unspecified: Secondary | ICD-10-CM | POA: Diagnosis not present

## 2015-08-06 MED ORDER — METOPROLOL SUCCINATE ER 25 MG PO TB24: 12.5000 mg | ORAL_TABLET | Freq: Every day | ORAL | Status: DC

## 2015-08-06 NOTE — Patient Instructions (Signed)
Your physician has recommended you make the following change in your medication: STOP METOPROLOL TARTRATE AND START METOPROLOL SUCCINATE 25 MG TABLETS - TAKE 1/2 TABLET DAILY  Your physician recommends that you return for lab work in: [place] NEXT APPOINTMENT IN 3 MONTHS  AN ORDER HAS [place] [facility].  THEY WILL CONTACT YOU TO SCHEDULE A SET UP TIME  Your physician recommends that you schedule a follow-up appointment in: 3 MONTHS WITH DR Martinique

## 2015-08-06 NOTE — Progress Notes (Signed)
Cardiology Office Note   Date:  08/06/2015   ID:  Kaitlyn Lozano, DOB 1950/08/17, MRN [id number]  PCP:  Reginia Naas, MD  Cardiologist:  Dr Martinique  Claiborne Stroble, PA-C   Chief Complaint  Patient presents with  . Hospitalization Follow-up    MI, stents place    History of Present Illness: Kaitlyn Lozano is a 65 y.o. female with a history of NSTEMI w/ PCI LAD, d/c 12/21, groin hematoma, seen in office 12/23 for concerns, Doppler w/ no pseudo or fistula  [name] presents for posthospital follow-up.  Since discharge from the hospital, she felt tired and has very little energy. She has been compliant with walking and has not done much around the house. She has not had chest pain, but just feels tired all the time and little weak at times. She went to the grocery store and struggled with the handicapped cart. She has not had chest pain or shortness of breath.  Her right groin is somewhat improved, there is still a knot, but the bruising is resolving. She is having no pain, numbness or tingling in that leg.  She wants to do cardiac rehabilitation. She wants to know if the fatigue and weak feeling is going to get better. She wants to know if it is okay to increase her activity. She is starting to get a little nauseated when she takes all of her morning medications and wants to know if there is anything we can do about that.   Past Medical History  Diagnosis Date  . Depression   . Depression   . Bronchitis     eosinophilic  . GERD (gastroesophageal reflux disease)   . Arthritis     OA  . CAD (coronary artery disease) 07/21/2015    a. 07/2015: 95% stenosis of LAD -->2.5 mm x 16 mm Synergy DES placed, 60% stenosis 1st Mrg, 55% stenosis 2nd Mrg, EF normal    Past Surgical History  Procedure Laterality Date  . Dnc    . Dilation and curettage of uterus  1993-1994  . Hernia repair    . Cardiac catheterization N/A 07/23/2015    Procedure: Left Heart Cath and Coronary  Angiography;  Surgeon: Leonie Man, MD;  Location: [facility];  Service: Cardiovascular;  Laterality: N/A;  . Cardiac catheterization N/A 07/23/2015    Procedure: Coronary Stent Intervention;  Surgeon: Leonie Man, MD;  Location: [facility];  Service: Cardiovascular;  Laterality: N/A;    Current Outpatient Prescriptions  Medication Sig Dispense Refill  . acetaminophen (TYLENOL) 650 MG CR tablet Take 1,300 mg by mouth at bedtime as needed for pain.    Marland Kitchen AMBULATORY NON FORMULARY MEDICATION Take 90 mg by mouth 2 (two) times daily. Medication Name: BRILINTA 90 mg BID (TWILIGHT Research study provided do not fill)    . AMBULATORY NON FORMULARY MEDICATION Take 81 mg by mouth daily. Medication Name: ASA 81 mg Daily ( TWILIGHT research study provided)    . atorvastatin (LIPITOR) 80 MG tablet Take 1 tablet (80 mg total) by mouth daily at 6 PM. 30 tablet 6  . Calcium Citrate-Vitamin D (CITRACAL + D PO) Take 1 tablet by mouth 2 (two) times daily.    . Cholecalciferol (VITAMIN D PO) Take 1 tablet by mouth daily.    . citalopram (CELEXA) 20 MG tablet Take 20 mg by mouth daily.     . metoprolol tartrate (LOPRESSOR) 25 MG tablet Take 0.5 tablets (12.5  mg total) by mouth 2 (two) times daily. 60 tablet 1  . Multiple Vitamins-Minerals (MULTIVITAMIN WITH MINERALS) tablet Take 1 tablet by mouth daily. Centrum Silver    . nitroGLYCERIN (NITROSTAT) 0.4 MG SL tablet Place 1 tablet (0.4 mg total) under the tongue every 5 (five) minutes as needed for chest pain. 20 tablet 0  . Turmeric 500 MG CAPS Take 500 mg by mouth daily.    . vitamin B-12 (CYANOCOBALAMIN) 1000 MCG tablet Take 1,000 mcg by mouth daily.    . vitamin C (ASCORBIC ACID) 500 MG tablet Take 500 mg by mouth daily.     No current facility-administered medications for this visit.    Allergies:   Sulfa antibiotics and Sulfonamide derivatives    Social History:  The patient  reports that she has never smoked. She has never used  smokeless tobacco. She reports that she drinks alcohol. She reports that she does not use illicit drugs.   Family History:  The patient's family history includes Aortic stenosis in her father; Atrial fibrillation in her mother; Heart attack in her maternal grandfather; Heart failure in her maternal grandmother and mother; Hypertension in her mother; Stroke in her mother; Transient ischemic attack in her mother.    ROS:  Please see the history of present illness. All other systems are reviewed and negative.    PHYSICAL EXAM: VS:  BP 96/56 mmHg  Pulse 72  Ht 5\' 4"  (1.626 m)  Wt 147 lb 1.6 oz (66.724 kg)  BMI 25.24 kg/m2 , BMI Body mass index is 25.24 kg/(m^2). GEN: Well nourished, well developed, female in no acute distress HEENT: normal for age  Neck: no JVD, no carotid bruit, no masses Cardiac: RRR; no murmur, no rubs, or gallops Respiratory:  clear to auscultation bilaterally, normal work of breathing GI: soft, nontender, nondistended, + BS MS: no deformity or atrophy; no edema; distal pulses are 2+ in all 4 extremities; right groin has a 2 x 3 cm hematoma at the site of the previous cath. There is a smaller hematoma distal to this. A faint femoral bruit is appreciated on the right. There is a large area of ecchymosis extending most of the way to her knee that is resolving. Skin: warm and dry, no rash Neuro:  Strength and sensation are intact Psych: euthymic mood, full affect   EKG:  EKG is ordered today. The ekg ordered today demonstrates sinus rhythm, evolving MI changes with improvement and lateral T-wave inversions   Recent Labs: 07/24/2015: BUN 14; Creatinine, Ser 0.82; Hemoglobin 11.6*; Platelets 231; Potassium 4.5; Sodium 139    Lipid Panel    Component Value Date/Time   CHOL 152 07/22/2015 0304   TRIG 112 07/22/2015 0304   HDL 61 07/22/2015 0304   CHOLHDL 2.5 07/22/2015 0304   VLDL 22 07/22/2015 0304   LDLCALC 69 07/22/2015 0304     Wt Readings from Last 3  Encounters:  08/06/15 147 lb 1.6 oz (66.724 kg)  07/26/15 147 lb (66.679 kg)  07/24/15 144 lb 13.5 oz (65.7 kg)     Other studies Reviewed: Additional studies/ records that were reviewed today include: Hospital records, echo and cath report.  ASSESSMENT AND PLAN:  1.  Non-STEMI: She is stable from a cardiac standpoint, but her blood pressure may be contributing to her symptoms of weakness and fatigue. The only medication that she is on which could affect her blood pressure is the metoprolol. We will try the lowest possible dose, Toprol-XL 12.5 mg daily. If she  does not tolerate this, the beta blocker will be discontinued completely. Her EF is normal and no ACE inhibitor was tried because of low blood pressure.  She is to continue the aspirin, the Brilinta and a statin in addition to a very low dose of beta blocker. She is to get a hepatic function profile and lipid profile in 3 months and follow-up with Dr. Martinique. She is encouraged to start cardiac rehabilitation. It is okay for her to increase her activity.  She takes multiple over-the-counter supplements in addition to the prescription medications. He is having some GI upset in the mornings. She is encouraged to change her supplements to lunch as the combination of all different medications may be contributing to her nausea.  2. Right groin hematoma: She was seen in the office 12/23 for this and an ultrasound was negative for pseudo-or fistula. She still has a hematoma in the area and a faint bruit. She is not having any pain in that leg and the ecchymosis from the hematoma is resolving. She is aware of the size of the hematoma and no stiff call us if he gets any larger. For now, we will not recheck her Dopplers. However, I would have a low threshold to recheck Dopplers if the hematoma starts increasing in size or if any new ecchymosis is seen.   Current medicines are reviewed at length with the patient today.  The patient has concerns  regarding medicines. All concerns were addressed  The following changes have been made:  Change Lopressor to Toprol-XL  Labs/ tests ordered today include:   Orders Placed This Encounter  Procedures  . Lipid panel  . Hepatic function panel  . AMB referral to cardiac rehabilitation  . EKG 12-Lead     Disposition:   FU with Dr. Martinique in 3 months  Signed, Lenoard Aden  08/06/2015 11:19 AM    [facility] [place], [place], [place]  [address] Phone: [phone]; Fax: [phone]

## 2015-08-15 ENCOUNTER — Encounter (HOSPITAL_COMMUNITY)
Admission: RE | Admit: 2015-08-15 | Discharge: 2015-08-15 | Disposition: A | Discharge status: Home / Self Care | Payer: 59 | Source: Ambulatory Visit | Attending: Cardiology | Admitting: Cardiology

## 2015-08-15 DIAGNOSIS — I252 Old myocardial infarction: Secondary | ICD-10-CM | POA: Insufficient documentation

## 2015-08-15 DIAGNOSIS — Z955 Presence of coronary angioplasty implant and graft: Secondary | ICD-10-CM | POA: Insufficient documentation

## 2015-08-15 NOTE — Progress Notes (Signed)
Cardiac Rehab Medication Review by a Pharmacist  Does the patient  feel that his/her medications are working for him/her?  yes  Has the patient been experiencing any side effects to the medications prescribed?  yes  Does the patient measure his/her own blood pressure or blood glucose at home?  no  Planning on starting to measure BP at home  Does the patient have any problems obtaining medications due to transportation or finances?   no  Understanding of regimen: excellent Understanding of indications: excellent Potential of compliance: excellent    Pharmacist comments: Ms. Kuruc has excellent understanding of her medication regimen and does not report any issues or side effects at this time.   Joya San, PharmD Clinical Pharmacy Resident Pager # [phone] 08/15/2015 8:40 AM

## 2015-08-16 ENCOUNTER — Telehealth: Payer: Self-pay | Admitting: Cardiology

## 2015-08-16 NOTE — Telephone Encounter (Signed)
Pt going to the dentist on Monday. She had a stent put in December,needs to know if she needs to do anything special or if she should keep the appt. Please let her know asap.

## 2015-08-20 NOTE — Telephone Encounter (Signed)
Returned call to patient 08/16/15 spoke to Rosaria Ferries PA advised no antibiotics needed before dental work.  Returned call to patient she stated she needed a letter to be out of work while she takes cardiac rehab until 11/22/15.Advised I will leave letter at front desk of [facility] office to pick up 08/21/15.

## 2015-08-20 NOTE — Telephone Encounter (Signed)
Returned call to patient letter to remain out of work until 11/22/15 will be left at front desk of [facility] office.

## 2015-08-20 NOTE — Telephone Encounter (Signed)
Pt called in requesting a letter from Dr. Martinique stating that she is currently in Cardiac Rehab and will not be returning to work until her rehab is complete. She says she needs this letter in order to file for medical leave. Please f/u with her as soon as possible.   Thanks

## 2015-08-21 ENCOUNTER — Encounter (HOSPITAL_COMMUNITY): Payer: Self-pay

## 2015-08-21 ENCOUNTER — Encounter (HOSPITAL_COMMUNITY)
Admission: RE | Admit: 2015-08-21 | Discharge: 2015-08-21 | Disposition: A | Discharge status: Home / Self Care | Payer: 59 | Source: Ambulatory Visit | Attending: Cardiology | Admitting: Cardiology

## 2015-08-21 DIAGNOSIS — I252 Old myocardial infarction: Secondary | ICD-10-CM | POA: Diagnosis present

## 2015-08-21 DIAGNOSIS — Z955 Presence of coronary angioplasty implant and graft: Secondary | ICD-10-CM | POA: Diagnosis not present

## 2015-08-21 NOTE — Progress Notes (Signed)
Pt started cardiac rehab today.  Pt tolerated light exercise without difficulty. VSS, telemetry-sinus , asymptomatic.  Medication list reconciled.  Pt verbalized compliance with medications and denies barriers to compliance. PSYCHOSOCIAL ASSESSMENT:  PHQ-1.  Pt reports 20 year history of depression, effectively treated with celexa.   Pt exhibits positive coping skills with hopeful outlook.   No psychosocial needs identified at this time, no psychosocial interventions necessary.    Pt enjoys reading, knitting, playing guitar, and collecting antique jewelry.   Pt cardiac rehab  goal is  to regain confidence in her health, developing heart healthy lifestyle and weight loss.  Pt encouraged to participate in lifestyle modification classes  to increase ability to achieve these goals.  Pt oriented to exercise equipment and routine.  Understanding verbalized.

## 2015-08-23 ENCOUNTER — Encounter (HOSPITAL_COMMUNITY)
Admission: RE | Admit: 2015-08-23 | Discharge: 2015-08-23 | Disposition: A | Discharge status: Home / Self Care | Payer: 59 | Source: Ambulatory Visit | Attending: Cardiology | Admitting: Cardiology

## 2015-08-23 DIAGNOSIS — I252 Old myocardial infarction: Secondary | ICD-10-CM | POA: Diagnosis not present

## 2015-08-26 ENCOUNTER — Encounter (HOSPITAL_COMMUNITY): Payer: Self-pay

## 2015-08-26 ENCOUNTER — Encounter (HOSPITAL_COMMUNITY)
Admission: RE | Admit: 2015-08-26 | Discharge: 2015-08-26 | Disposition: A | Discharge status: Home / Self Care | Payer: 59 | Source: Ambulatory Visit | Attending: Cardiology | Admitting: Cardiology

## 2015-08-26 ENCOUNTER — Other Ambulatory Visit: Payer: Self-pay

## 2015-08-26 ENCOUNTER — Emergency Department (HOSPITAL_COMMUNITY)
Admission: EM | Admit: 2015-08-26 | Discharge: 2015-08-26 | Disposition: A | Discharge status: Home / Self Care | Payer: 59 | Attending: Emergency Medicine | Admitting: Emergency Medicine

## 2015-08-26 DIAGNOSIS — Z79899 Other long term (current) drug therapy: Secondary | ICD-10-CM | POA: Insufficient documentation

## 2015-08-26 DIAGNOSIS — F329 Major depressive disorder, single episode, unspecified: Secondary | ICD-10-CM | POA: Insufficient documentation

## 2015-08-26 DIAGNOSIS — Z9889 Other specified postprocedural states: Secondary | ICD-10-CM | POA: Diagnosis not present

## 2015-08-26 DIAGNOSIS — Z8719 Personal history of other diseases of the digestive system: Secondary | ICD-10-CM | POA: Insufficient documentation

## 2015-08-26 DIAGNOSIS — Z8709 Personal history of other diseases of the respiratory system: Secondary | ICD-10-CM | POA: Insufficient documentation

## 2015-08-26 DIAGNOSIS — R42 Dizziness and giddiness: Secondary | ICD-10-CM | POA: Diagnosis not present

## 2015-08-26 DIAGNOSIS — R55 Syncope and collapse: Secondary | ICD-10-CM | POA: Diagnosis present

## 2015-08-26 DIAGNOSIS — I2510 Atherosclerotic heart disease of native coronary artery without angina pectoris: Secondary | ICD-10-CM | POA: Insufficient documentation

## 2015-08-26 DIAGNOSIS — I252 Old myocardial infarction: Secondary | ICD-10-CM | POA: Diagnosis not present

## 2015-08-26 DIAGNOSIS — M1990 Unspecified osteoarthritis, unspecified site: Secondary | ICD-10-CM | POA: Insufficient documentation

## 2015-08-26 DIAGNOSIS — Z87891 Personal history of nicotine dependence: Secondary | ICD-10-CM | POA: Insufficient documentation

## 2015-08-26 DIAGNOSIS — R110 Nausea: Secondary | ICD-10-CM | POA: Diagnosis not present

## 2015-08-26 LAB — URINALYSIS, ROUTINE W REFLEX MICROSCOPIC
Bilirubin Urine: NEGATIVE
GLUCOSE, UA: NEGATIVE mg/dL
Hgb urine dipstick: NEGATIVE
Ketones, ur: NEGATIVE mg/dL
LEUKOCYTES UA: NEGATIVE
Nitrite: NEGATIVE
PROTEIN: NEGATIVE mg/dL
SPECIFIC GRAVITY, URINE: 1.017 (ref 1.005–1.030)
pH: 6.5 (ref 5.0–8.0)

## 2015-08-26 LAB — CBC
HCT: 39.1 % (ref 36.0–46.0)
Hemoglobin: 13.9 g/dL (ref 12.0–15.0)
MCH: 33.9 pg (ref 26.0–34.0)
MCHC: 35.5 g/dL (ref 30.0–36.0)
MCV: 95.4 fL (ref 78.0–100.0)
PLATELETS: 350 10*3/uL (ref 150–400)
RBC: 4.1 MIL/uL (ref 3.87–5.11)
RDW: 15.2 % (ref 11.5–15.5)
WBC: 8 10*3/uL (ref 4.0–10.5)

## 2015-08-26 LAB — BASIC METABOLIC PANEL
Anion gap: 16 — ABNORMAL HIGH (ref 5–15)
BUN: 18 mg/dL (ref 6–20)
CALCIUM: 9.8 mg/dL (ref 8.9–10.3)
CHLORIDE: 102 mmol/L (ref 101–111)
CO2: 24 mmol/L (ref 22–32)
CREATININE: 0.97 mg/dL (ref 0.44–1.00)
GFR calc Af Amer: >60 mL/min (ref >60)
GFR calc non Af Amer: 60 mL/min — ABNORMAL LOW (ref >60)
GLUCOSE: 77 mg/dL (ref 65–99)
Potassium: 4.1 mmol/L (ref 3.5–5.1)
Sodium: 142 mmol/L (ref 135–145)

## 2015-08-26 LAB — I-STAT TROPONIN, ED
TROPONIN I, POC: 0 ng/mL (ref 0.00–0.08)
Troponin i, poc: 0 ng/mL (ref 0.00–0.08)

## 2015-08-26 LAB — CBG MONITORING, ED: GLUCOSE-CAPILLARY: 87 mg/dL (ref 65–99)

## 2015-08-26 MED ORDER — SODIUM CHLORIDE 0.9 % IV SOLN: 500.0000 mL | Freq: Once | INTRAVENOUS | Status: DC

## 2015-08-26 NOTE — ED Provider Notes (Signed)
CSN: [id number]     Arrival date & time 08/26/15  1426 History   First MD Initiated Contact with Patient 08/26/15 1453     Chief Complaint  Patient presents with  . Near Syncope     HPI  Presents for evaluation after an episode of dizziness and near syncope at cardiac rehabilitation.  Patient was admitted with an episode of chest pain in December. December 20 underwent a heart catheterization. 1.  Mid LAD lesion, 95% stenosed. PCI with Synergy DES 2.5 mm x 16 mm (2.75 mm). Post intervention, there is a 0% residual stenosis. 2. Bifurcation OM lesions: 1st Mrg lesion, 60% stenosed. 2nd Mrg lesion, 55% stenosed. The left ventricular systolic function is normal.  Recovered well. Echo showed 60-65% EF. Today was a cardiac rehabilitation for her third session. Head rigidly stationary bike for 10 minutes per protocol. Was asymptomatic during exercise. Got off of the bike and took a few steps. Felt what she describes as lightheadedness. Then ultimately states she felt like things were spinning when she was placed in a wheelchair. Became nauseated. Felt quite lightheaded and "on the edge" of passing out. Initial blood pressure unobtainable. Second BP noted to be 50. Pulse not recorded/noted.  Was laid supine and Trendelenburg. Transferred here. Asymptomatic upon her arrival. At no time did she have chest pain or shortness of breath reminiscent of her symptoms/anginal equivalent in December.  Past Medical History  Diagnosis Date  . Depression   . Depression   . Bronchitis     eosinophilic  . GERD (gastroesophageal reflux disease)   . Arthritis     OA  . CAD (coronary artery disease) 07/21/2015    a. 07/2015: 95% stenosis of LAD -->2.5 mm x 16 mm Synergy DES placed, 60% stenosis 1st Mrg, 55% stenosis 2nd Mrg, EF normal   Past Surgical History  Procedure Laterality Date  . Dnc    . Dilation and curettage of uterus  1993-1994  . Hernia repair    . Cardiac catheterization N/A 07/23/2015     Procedure: Left Heart Cath and Coronary Angiography;  Surgeon: Leonie Man, MD;  Location: [facility];  Service: Cardiovascular;  Laterality: N/A;  . Cardiac catheterization N/A 07/23/2015    Procedure: Coronary Stent Intervention;  Surgeon: Leonie Man, MD;  Location: [facility];  Service: Cardiovascular;  Laterality: N/A;   Family History  Problem Relation Age of Onset  . Atrial fibrillation Mother   . Transient ischemic attack Mother   . Hypertension Mother   . Stroke Mother   . Heart failure Mother   . Heart failure Maternal Grandmother   . Heart attack Maternal Grandfather   . Aortic stenosis Father    Social History  Substance Use Topics  . Smoking status: Former Research scientist (life sciences)  . Smokeless tobacco: Never Used  . Alcohol Use: Yes     Comment: rare   OB History    No data available     Review of Systems  Constitutional: Negative for fever, chills, diaphoresis, appetite change and fatigue.  HENT: Negative for mouth sores, sore throat and trouble swallowing.   Eyes: Negative for visual disturbance.  Respiratory: Negative for cough, chest tightness, shortness of breath and wheezing.   Cardiovascular: Negative for chest pain.  Gastrointestinal: Positive for nausea. Negative for vomiting, abdominal pain, diarrhea and abdominal distention.  Endocrine: Negative for polydipsia, polyphagia and polyuria.  Genitourinary: Negative for dysuria, frequency and hematuria.  Musculoskeletal: Negative for gait problem.  Skin:  Negative for color change, pallor and rash.  Neurological: Positive for light-headedness. Negative for dizziness, syncope and headaches.  Hematological: Does not bruise/bleed easily.  Psychiatric/Behavioral: Negative for behavioral problems and confusion.      Allergies  Sulfa antibiotics and Sulfonamide derivatives  Home Medications   Prior to Admission medications   Medication Sig Start Date End Date Taking? Authorizing Provider   acetaminophen (TYLENOL) 650 MG CR tablet Take 650 mg by mouth every 8 (eight) hours as needed for pain.    Yes Historical Provider, MD  AMBULATORY NON FORMULARY MEDICATION Take 90 mg by mouth 2 (two) times daily. Medication Name: BRILINTA 90 mg BID (TWILIGHT Research study provided do not fill) 07/25/15  Yes Burnell Blanks, MD  AMBULATORY NON FORMULARY MEDICATION Take 81 mg by mouth daily. Medication Name: ASA 81 mg Daily ( TWILIGHT research study provided) 07/25/15  Yes Burnell Blanks, MD  atorvastatin (LIPITOR) 80 MG tablet Take 1 tablet (80 mg total) by mouth daily at 6 PM. 07/23/15  Yes Reyne Dumas, MD  Calcium Citrate-Vitamin D (CITRACAL + D PO) Take 1 tablet by mouth 2 (two) times daily.   Yes Historical Provider, MD  Cholecalciferol (VITAMIN D PO) Take 1 tablet by mouth daily.   Yes Historical Provider, MD  citalopram (CELEXA) 20 MG tablet Take 20 mg by mouth daily.  08/09/12  Yes Historical Provider, MD  metoprolol succinate (TOPROL XL) 25 MG 24 hr tablet Take 0.5 tablets (12.5 mg total) by mouth daily. 08/06/15  Yes Rhonda G Barrett, PA-C  Multiple Vitamins-Minerals (MULTIVITAMIN WITH MINERALS) tablet Take 1 tablet by mouth daily. Centrum Silver   Yes Historical Provider, MD  nitroGLYCERIN (NITROSTAT) 0.4 MG SL tablet Place 1 tablet (0.4 mg total) under the tongue every 5 (five) minutes as needed for chest pain. 07/24/15  Yes Belkys A Regalado, MD  Turmeric 500 MG CAPS Take 500 mg by mouth daily.   Yes Historical Provider, MD  vitamin B-12 (CYANOCOBALAMIN) 1000 MCG tablet Take 1,000 mcg by mouth daily.   Yes Historical Provider, MD  vitamin C (ASCORBIC ACID) 500 MG tablet Take 500 mg by mouth daily.   Yes Historical Provider, MD   BP 124/60 mmHg  Pulse 85  Temp(Src) 98.2 F (36.8 C) (Oral)  Resp 14  Ht 5\' 4"  (1.626 m)  Wt 140 lb (63.504 kg)  BMI 24.02 kg/m2  SpO2 100% Physical Exam  Constitutional: She is oriented to person, place, and time. She appears well-developed  and well-nourished. No distress.  HENT:  Head: Normocephalic.  Eyes: Conjunctivae are normal. Pupils are equal, round, and reactive to light. No scleral icterus.  Neck: Normal range of motion. Neck supple. No thyromegaly present.  Cardiovascular: Normal rate and regular rhythm.  Exam reveals no gallop and no friction rub.   No murmur heard. Pulmonary/Chest: Effort normal and breath sounds normal. No respiratory distress. She has no wheezes. She has no rales.  Abdominal: Soft. Bowel sounds are normal. She exhibits no distension. There is no tenderness. There is no rebound.  Musculoskeletal: Normal range of motion.  Neurological: She is alert and oriented to person, place, and time.  Skin: Skin is warm and dry. No rash noted.  Psychiatric: She has a normal mood and affect. Her behavior is normal.    ED Course  Procedures (including critical care time) Labs Review Labs Reviewed  BASIC METABOLIC PANEL - Abnormal; Notable for the following:    GFR calc non Af Amer 60 (*)    Anion  gap 16 (*)    All other components within normal limits  CBC  URINALYSIS, ROUTINE W REFLEX MICROSCOPIC (NOT AT [facility])  CBG MONITORING, ED  I-STAT TROPOININ, ED  I-STAT TROPOININ, ED    Imaging Review No results found. I have personally reviewed and evaluated these images and lab results as part of my medical decision-making.   EKG Interpretation   Date/Time:  Monday August 26 2015 14:36:33 EST Ventricular Rate:  72 PR Interval:  136 QRS Duration: 91 QT Interval:  394 QTC Calculation: 431 R Axis:   83 Text Interpretation:  Sinus rhythm Borderline right axis deviation  Confirmed by Jeneen Rinks  MD, [place] ([id number]) on 08/26/2015 3:28:06 PM      MDM   Final diagnoses:  Vasovagal near syncope   Patient discussed with Dr. Stanford Breed. I described what I felt was likely a vasovagal episode. Described an unchanged EKG and normal enzymes. Patient describes several episodes younger in life that sound quite like  vagal episodes as well. He felt the patient could safely be discharged with that description of the symptoms, to follow up routinely in their office, and to continue her cardiac rehabilitation.     Tanna Furry, MD 08/26/15 [phone]

## 2015-08-26 NOTE — ED Notes (Signed)
Per Cardiac Rehab RN, Pt was on the stationary bike when she started to complain of dizziness, became diaphoretic and nauseous, and BP started to drop. Pt was able to be helped to a bed where she was placed in Trendelenburg. Pt's original BP was 120/70, when RN tried to reassess during episode, pt's BP was 50 systolic Palpable. Pt never lost consciousness. Pt is alert and oriented x4 upon arrival. Denies any chest pain, SOB.

## 2015-08-26 NOTE — Discharge Instructions (Signed)
Near-Syncope Near-syncope (commonly known as near fainting) is sudden weakness, dizziness, or feeling like you might pass out. During an episode of near-syncope, you may also develop pale skin, have tunnel vision, or feel sick to your stomach (nauseous). Near-syncope may occur when getting up after sitting or while standing for a long time. It is caused by a sudden decrease in blood flow to the brain. This decrease can result from various causes or triggers, most of which are not serious. However, because near-syncope can sometimes be a sign of something serious, a medical evaluation is required. The specific cause is often not determined. HOME CARE INSTRUCTIONS  Monitor your condition for any changes. The following actions may help to alleviate any discomfort you are experiencing:  Have someone stay with you until you feel stable.  Lie down right away and prop your feet up if you start feeling like you might faint. Breathe deeply and steadily. Wait until all the symptoms have passed. Most of these episodes last only a few minutes. You may feel tired for several hours.   Drink enough fluids to keep your urine clear or pale yellow.   If you are taking blood pressure or heart medicine, get up slowly when seated or lying down. Take several minutes to sit and then stand. This can reduce dizziness.  Follow up with your health care provider as directed. SEEK IMMEDIATE MEDICAL CARE IF:   You have a severe headache.   You have unusual pain in the chest, abdomen, or back.   You are bleeding from the mouth or rectum, or you have black or tarry stool.   You have an irregular or very fast heartbeat.   You have repeated fainting or have seizure-like jerking during an episode.   You faint when sitting or lying down.   You have confusion.   You have difficulty walking.   You have severe weakness.   You have vision problems.  MAKE SURE YOU:   Understand these instructions.  Will  watch your condition.  Will get help right away if you are not doing well or get worse.   This information is not intended to replace advice given to you by your health care provider. Make sure you discuss any questions you have with your health care provider.   Document Released: 07/20/2005 Document Revised: 07/25/2013 Document Reviewed: 12/23/2012 Elsevier Interactive Patient Education 2016 [employer].  Syncope, commonly known as fainting, is a temporary loss of consciousness. It occurs when the blood flow to the brain is reduced. Vasovagal syncope (also called neurocardiogenic syncope) is a fainting spell in which the blood flow to the brain is reduced because of a sudden drop in heart rate and blood pressure. Vasovagal syncope occurs when the brain and the cardiovascular system (blood vessels) do not adequately communicate and respond to each other. This is the most common cause of fainting. It often occurs in response to fear or some other type of emotional or physical stress. The body has a reaction in which the heart starts beating too slowly or the blood vessels expand, reducing blood pressure. This type of fainting spell is generally considered harmless. However, injuries can occur if a person takes a sudden fall during a fainting spell.  CAUSES  Vasovagal syncope occurs when a person's blood pressure and heart rate decrease suddenly, usually in response to a trigger. Many things and situations can trigger an episode. Some of these include:   Pain.   Fear.   The sight of  blood or medical procedures, such as blood being drawn from a vein.   Common activities, such as coughing, swallowing, stretching, or going to the bathroom.   Emotional stress.   Prolonged standing, especially in a warm environment.   Lack of sleep or rest.   Prolonged lack of food.   Prolonged lack of fluids.   Recent illness.  The use of certain drugs that affect blood pressure, such as  cocaine, alcohol, marijuana, inhalants, and opiates.  SYMPTOMS  Before the fainting episode, you may:   Feel dizzy or light headed.   Become pale.  Sense that you are going to faint.   Feel like the room is spinning.   Have tunnel vision, only seeing directly in front of you.   Feel sick to your stomach (nauseous).   See spots or slowly lose vision.   Hear ringing in your ears.   Have a headache.   Feel warm and sweaty.   Feel a sensation of pins and needles. During the fainting spell, you will generally be unconscious for no longer than a couple minutes before waking up and returning to normal. If you get up too quickly before your body can recover, you may faint again. Some twitching or jerky movements may occur during the fainting spell.  DIAGNOSIS  Your health care provider will ask about your symptoms, take a medical history, and perform a physical exam. Various tests may be done to rule out other causes of fainting. These may include blood tests and tests to check the heart, such as electrocardiography, echocardiography, and possibly an electrophysiology study. When other causes have been ruled out, a test may be done to check the body's response to changes in position (tilt table test). TREATMENT  Most cases of vasovagal syncope do not require treatment. Your health care provider may recommend ways to avoid fainting triggers and may provide home strategies for preventing fainting. If you must be exposed to a possible trigger, you can drink additional fluids to help reduce your chances of having an episode of vasovagal syncope. If you have warning signs of an oncoming episode, you can respond by positioning yourself favorably (lying down). If your fainting spells continue, you may be given medicines to prevent fainting. Some medicines may help make you more resistant to repeated episodes of vasovagal syncope. Special exercises or compression stockings may be recommended.  In rare cases, the surgical placement of a pacemaker is considered. HOME CARE INSTRUCTIONS   Learn to identify the warning signs of vasovagal syncope.   Sit or lie down at the first warning sign of a fainting spell. If sitting, put your head down between your legs. If you lie down, swing your legs up in the air to increase blood flow to the brain.   Avoid hot tubs and saunas.  Avoid prolonged standing.  Drink enough fluids to keep your urine clear or pale yellow. Avoid caffeine.  Increase salt in your diet as directed by your health care provider.   If you have to stand for a long time, perform movements such as:   Crossing your legs.   Flexing and stretching your leg muscles.   Squatting.   Moving your legs.   Bending over.   Only take over-the-counter or prescription medicines as directed by your health care provider. Do not suddenly stop any medicines without asking your health care provider first. [place] IF:   Your fainting spells continue or happen more frequently in spite of treatment.  You lose consciousness for more than a couple minutes.  You have fainting spells during or after exercising or after being startled.   You have new symptoms that occur with the fainting spells, such as:   Shortness of breath.  Chest pain.   Irregular heartbeat.   You have episodes of twitching or jerky movements that last longer than a few seconds.  You have episodes of twitching or jerky movements without obvious fainting. SEEK IMMEDIATE MEDICAL CARE IF:   You have injuries or bleeding after a fainting spell.   You have episodes of twitching or jerky movements that last longer than 5 minutes.   You have more than one spell of twitching or jerky movements before returning to consciousness after fainting.   This information is not intended to replace advice given to you by your health care provider. Make sure you discuss any questions you have  with your health care provider.   Document Released: 07/06/2012 Document Revised: 12/04/2014 Document Reviewed: 07/06/2012 Elsevier Interactive Patient Education [employer].

## 2015-08-26 NOTE — ED Notes (Signed)
Pt CBG, 87. 

## 2015-08-26 NOTE — Progress Notes (Signed)
Kaitlyn Lozano became lightheaded on the airdyne today at cardiac rehab. Vital signs and heart had been stable. Entry blood pressure 108/62. Blood pressure noted initially at 120/70 on the airdyne.  Patient reported feeling lightheaded. Exercise stopped. Upon ascultation unable to hear a manual blood pressure. Patient was assisted to a wheel chair. Blood pressure checked with the automatic blood pressure cuff. Blood pressure 53/40. Rapid response team called the code blue called. Patient became lethargic, diaphoretic and nauseated.  Patient was placed on a stretcher and placed in reverse trendelenburg.  Repeat blood pressure 112/78. Heart rate remained in the 70 and 80's sinus with a few PVC's. IV therapy RN placed and IV with NS in the patient's right arm. Code blue cancelled. Trish the cardmaster for [facility] called and notified of today's events. Patient's daughter called and notified. Once [name] was placed on the stretcher she felt better. Patient was taken to the ED via stretcher on the [place]. Report given to the ED RN.

## 2015-08-26 NOTE — ED Notes (Signed)
MD James at the bedside  

## 2015-08-26 NOTE — ED Notes (Signed)
Pt ambulated to restroom. 

## 2015-08-27 ENCOUNTER — Encounter: Payer: Self-pay | Admitting: *Deleted

## 2015-08-27 ENCOUNTER — Telehealth (HOSPITAL_COMMUNITY): Payer: Self-pay | Admitting: *Deleted

## 2015-08-27 ENCOUNTER — Telehealth (HOSPITAL_COMMUNITY): Payer: Self-pay | Admitting: Cardiac Rehabilitation

## 2015-08-27 DIAGNOSIS — Z006 Encounter for examination for normal comparison and control in clinical research program: Secondary | ICD-10-CM

## 2015-08-27 NOTE — Telephone Encounter (Signed)
-----   Message from Peter M Martinique, MD sent at 08/27/2015  9:32 AM EST ----- Regarding: RE: cardiac rehab I think she can return to Rehab.  Peter Martinique MD, [facility]   ----- Message -----    From: Lowell Guitar, RN    Sent: 08/27/2015   8:49 AM      To: Peter M Martinique, MD Subject: cardiac rehab                                  Dear Dr. Martinique,  Kaitlyn Lozano was evaluated in ED yesterday for vasovagal episode at cardiac rehab.  Does she have clearance to return to exercise?  Are there any restrictions?  Thank you, Andi Hence, RN, BSN Cardiac Pulmonary Rehab

## 2015-08-27 NOTE — Progress Notes (Signed)
TWILIGHT Research Study 1 month telephone follow up completed. Patient states she has been 100% compliant with study drug and had not had any bleeding events. Had a near syncope event at cardiac rehab yesterday that lead her to ER but discharged to home. She is feeling much better today. Next research visit 10/30/15 before or after her cardiac rehab appointment I will take her study drugs to her. Questions encouraged and answered.

## 2015-08-28 ENCOUNTER — Encounter (HOSPITAL_COMMUNITY)
Admission: RE | Admit: 2015-08-28 | Discharge: 2015-08-28 | Disposition: A | Discharge status: Home / Self Care | Payer: 59 | Source: Ambulatory Visit | Attending: Cardiology | Admitting: Cardiology

## 2015-08-28 DIAGNOSIS — I252 Old myocardial infarction: Secondary | ICD-10-CM | POA: Diagnosis not present

## 2015-08-28 NOTE — Progress Notes (Signed)
Pt returned to cardiac rehab today.  Pt exercised without difficulty.  Pt given gatorade after first station.  Pt also asked to decrease workload on bicycle. VSS.   Will continue to monitor.

## 2015-08-29 ENCOUNTER — Telehealth: Payer: Self-pay

## 2015-08-29 NOTE — Telephone Encounter (Signed)
Spoke to patient.Received message from Verdis Frederickson in cardiac rehab about episode of  B/P dropping at rehab on 08/26/15.Patient was taken to ED for evaluation.Patient stated she is better was able to exercise today for 30 min with no problems.Dr.Jordan advised continue same medications.

## 2015-08-30 ENCOUNTER — Encounter (HOSPITAL_COMMUNITY)
Admission: RE | Admit: 2015-08-30 | Discharge: 2015-08-30 | Disposition: A | Discharge status: Home / Self Care | Payer: 59 | Source: Ambulatory Visit | Attending: Cardiology | Admitting: Cardiology

## 2015-08-30 DIAGNOSIS — I252 Old myocardial infarction: Secondary | ICD-10-CM | POA: Diagnosis not present

## 2015-09-02 ENCOUNTER — Encounter (HOSPITAL_COMMUNITY)
Admission: RE | Admit: 2015-09-02 | Discharge: 2015-09-02 | Disposition: A | Discharge status: Home / Self Care | Payer: 59 | Source: Ambulatory Visit | Attending: Cardiology | Admitting: Cardiology

## 2015-09-02 DIAGNOSIS — I252 Old myocardial infarction: Secondary | ICD-10-CM | POA: Diagnosis not present

## 2015-09-03 ENCOUNTER — Encounter: Payer: Self-pay | Admitting: Cardiology

## 2015-09-04 ENCOUNTER — Encounter (HOSPITAL_COMMUNITY)
Admission: RE | Admit: 2015-09-04 | Discharge: 2015-09-04 | Disposition: A | Discharge status: Home / Self Care | Payer: 59 | Source: Ambulatory Visit | Attending: Cardiology | Admitting: Cardiology

## 2015-09-04 DIAGNOSIS — I252 Old myocardial infarction: Secondary | ICD-10-CM | POA: Insufficient documentation

## 2015-09-04 DIAGNOSIS — Z955 Presence of coronary angioplasty implant and graft: Secondary | ICD-10-CM | POA: Insufficient documentation

## 2015-09-06 ENCOUNTER — Encounter (HOSPITAL_COMMUNITY)
Admission: RE | Admit: 2015-09-06 | Discharge: 2015-09-06 | Disposition: A | Discharge status: Home / Self Care | Payer: 59 | Source: Ambulatory Visit | Attending: Cardiology | Admitting: Cardiology

## 2015-09-06 DIAGNOSIS — I252 Old myocardial infarction: Secondary | ICD-10-CM | POA: Diagnosis not present

## 2015-09-06 NOTE — Progress Notes (Signed)
Reviewed home exercise guidelines with patient including endpoints, temperature precautions, target heart rate and rate of perceived exertion. Pt is walking 30 minutes daily as her mode of home exercise. Pt voices understanding of instructions given. Sol Passer, MS, ACSM CCEP

## 2015-09-09 ENCOUNTER — Encounter (HOSPITAL_COMMUNITY)
Admission: RE | Admit: 2015-09-09 | Discharge: 2015-09-09 | Disposition: A | Discharge status: Home / Self Care | Payer: 59 | Source: Ambulatory Visit | Attending: Cardiology | Admitting: Cardiology

## 2015-09-09 DIAGNOSIS — I252 Old myocardial infarction: Secondary | ICD-10-CM | POA: Diagnosis not present

## 2015-09-11 ENCOUNTER — Encounter (HOSPITAL_COMMUNITY)
Admission: RE | Admit: 2015-09-11 | Discharge: 2015-09-11 | Disposition: A | Discharge status: Home / Self Care | Payer: 59 | Source: Ambulatory Visit | Attending: Cardiology | Admitting: Cardiology

## 2015-09-11 DIAGNOSIS — I252 Old myocardial infarction: Secondary | ICD-10-CM | POA: Diagnosis not present

## 2015-09-13 ENCOUNTER — Encounter (HOSPITAL_COMMUNITY)
Admission: RE | Admit: 2015-09-13 | Discharge: 2015-09-13 | Disposition: A | Discharge status: Home / Self Care | Payer: 59 | Source: Ambulatory Visit | Attending: Cardiology | Admitting: Cardiology

## 2015-09-13 DIAGNOSIS — I252 Old myocardial infarction: Secondary | ICD-10-CM | POA: Diagnosis not present

## 2015-09-13 NOTE — Progress Notes (Signed)
QUALITY OF LIFE SCORE REVIEW  Pt completed Quality of Life survey as a participant in Cardiac Rehab. Scores 21.0 or below are considered low. Pt score very low in several areas Overall 22, Health and Function 23, socioeconomic 19, physiological and spiritual 46, family 70. Patient quality of life slightly altered by her recent cardiac event. Pt does have concerns and fears about her health. Pt recognizes that she had significant work related stress for the months leading up to her event. Pt plans to retire in the next few months. Pt is looking forward to this.  Pt given information about [facility] support group.    Offered emotional support and reassurance.  Will continue to monitor and intervene as necessary.

## 2015-09-16 ENCOUNTER — Encounter (HOSPITAL_COMMUNITY)
Admission: RE | Admit: 2015-09-16 | Discharge: 2015-09-16 | Disposition: A | Discharge status: Home / Self Care | Payer: 59 | Source: Ambulatory Visit | Attending: Cardiology | Admitting: Cardiology

## 2015-09-16 DIAGNOSIS — I252 Old myocardial infarction: Secondary | ICD-10-CM | POA: Diagnosis not present

## 2015-09-16 NOTE — Progress Notes (Signed)
Kaitlyn Lozano 65 y.o. female Nutrition Note Spoke with pt.  Nutrition Survey reviewed with pt. Pt is following Step 2 of the Therapeutic Lifestyle Changes diet. Pt wants to lose wt. Pt has been trying to lose wt by following the Zone diet. Pt states she has been getting diet advice from her daughter, who is an Therapist, sports and works at [employer] (across from cardiac rehab).  Pt expressed understanding of the information reviewed. Pt aware of nutrition education classes offered and plans on attending nutrition classes. No results found for: HGBA1C Wt Readings from Last 3 Encounters:  08/26/15 140 lb (63.504 kg)  08/15/15 146 lb 9.7 oz (66.5 kg)  08/06/15 147 lb 1.6 oz (66.724 kg)   Nutrition Diagnosis ? Food-and nutrition-related knowledge deficit related to lack of exposure to information as related to diagnosis of: ? CVD ? Overweight related to excessive energy intake as evidenced by a BMI of 25.4  Nutrition Intervention ? Benefits of adopting Therapeutic Lifestyle Changes discussed when Medficts reviewed. ? Pt to attend the Portion Distortion class ? Pt to attend the  ? Nutrition I class - met 09/10/15                       ? Nutrition II class ? Pt given handouts for: ? Nutrition II class ? Continue client-centered nutrition education by RD, as part of interdisciplinary care.  Goal(s) ? Pt to identify food quantities necessary to achieve weight loss of 6-24 lb (2.7-10.9 kg) at graduation from cardiac rehab.  ? Pt to describe the benefit of including fruits, vegetables, whole grains, and low-fat dairy products in a heart healthy meal plan.  Monitor and Evaluate progress toward nutrition goal with team.  Derek Mound, M.Ed, RD, LDN, CDE 09/16/2015 4:03 PM

## 2015-09-18 ENCOUNTER — Encounter (HOSPITAL_COMMUNITY)
Admission: RE | Admit: 2015-09-18 | Discharge: 2015-09-18 | Disposition: A | Discharge status: Home / Self Care | Payer: 59 | Source: Ambulatory Visit | Attending: Cardiology | Admitting: Cardiology

## 2015-09-18 DIAGNOSIS — I252 Old myocardial infarction: Secondary | ICD-10-CM | POA: Diagnosis not present

## 2015-09-18 NOTE — Progress Notes (Signed)
30 day psycosocial assessment  No psychosocial needs identfied, no interventions necessary.  Will continue to monitor.  Pt is exercising on her own at home.

## 2015-09-20 ENCOUNTER — Telehealth: Payer: Self-pay | Admitting: Cardiology

## 2015-09-20 ENCOUNTER — Encounter (HOSPITAL_COMMUNITY)
Admission: RE | Admit: 2015-09-20 | Discharge: 2015-09-20 | Disposition: A | Discharge status: Home / Self Care | Payer: 59 | Source: Ambulatory Visit | Attending: Cardiology | Admitting: Cardiology

## 2015-09-20 DIAGNOSIS — I252 Old myocardial infarction: Secondary | ICD-10-CM | POA: Diagnosis not present

## 2015-09-20 NOTE — Telephone Encounter (Signed)
Returned call to [place] at cardiac rehab.She wanted Dr.Jordan to know patient felt dizzy this afternoon.She checked orthostatic B/Ps supine 127/68 pulse 79 sitting 115/70 pulse 79 standing 100/73 pulse 85.Stated she was dizzy after checking all 3 B/Ps.She takes Toprol 25 mg 1/2 tablet daily.Advised I will send message to [place].

## 2015-09-20 NOTE — Progress Notes (Signed)
Pt c/o dizziness during exercise on airdyne bicycle at cardiac rehab.  Initial BP:  112/74.  Pt states relief of symptoms and continued to exercise. Peak exercise BP:  154/70, pt asymptomatic.  However post exercise pt began to complain of dizziness again. orthostat BP:  127/68 HR-79 lying, 115/70, HR-79 sitting, 100/73 HR- 85 standing.  Pt given gatorade. Pt resolved.  Pt able to walk track without difficulty.  PC to Dr. Morrison Old nurse to discuss. Left message for nurse to return call.  Pt symptoms resolved after drinking gatorade. Pt went home.  Malachy Mood, Dr. Doug Sou nurse will call pt at home after she reviews with Dr. Martinique. Pt verbalized understanding.

## 2015-09-20 NOTE — Telephone Encounter (Signed)
New message    Cardiac rehab .patient C/O dizziness at exercise today  .   Pt C/O BP issue: STAT if pt C/O blurred vision, one-sided weakness or slurred speech  1. What are your last 5 BP readings? Today  At rest 127/68 , standing  100/73   2. Are you having any other symptoms (ex. Dizziness, headache, blurred vision, passed out)? dizziness  3. What is your BP issue? Does B/p need to be adjust.

## 2015-09-22 NOTE — Telephone Encounter (Signed)
Mildly orthostatic but this apparently did not correlate with symptoms so I don't think Toprol is the issue. Just monitor for now.  Peter Martinique MD, [facility]

## 2015-09-23 ENCOUNTER — Encounter (HOSPITAL_COMMUNITY)
Admission: RE | Admit: 2015-09-23 | Discharge: 2015-09-23 | Disposition: A | Discharge status: Home / Self Care | Payer: 59 | Source: Ambulatory Visit | Attending: Cardiology | Admitting: Cardiology

## 2015-09-23 DIAGNOSIS — I252 Old myocardial infarction: Secondary | ICD-10-CM | POA: Diagnosis not present

## 2015-09-23 NOTE — Telephone Encounter (Signed)
Returned call to patient.Dr.Jordan's recommendations given.Advised to call back if B/P continues to be low.

## 2015-09-25 ENCOUNTER — Encounter (HOSPITAL_COMMUNITY)
Admission: RE | Admit: 2015-09-25 | Discharge: 2015-09-25 | Disposition: A | Discharge status: Home / Self Care | Payer: 59 | Source: Ambulatory Visit | Attending: Cardiology | Admitting: Cardiology

## 2015-09-25 DIAGNOSIS — I252 Old myocardial infarction: Secondary | ICD-10-CM | POA: Diagnosis not present

## 2015-09-27 ENCOUNTER — Encounter (HOSPITAL_COMMUNITY)
Admission: RE | Admit: 2015-09-27 | Discharge: 2015-09-27 | Disposition: A | Discharge status: Home / Self Care | Payer: 59 | Source: Ambulatory Visit | Attending: Cardiology | Admitting: Cardiology

## 2015-09-27 DIAGNOSIS — I252 Old myocardial infarction: Secondary | ICD-10-CM | POA: Diagnosis not present

## 2015-09-30 ENCOUNTER — Encounter (HOSPITAL_COMMUNITY)
Admission: RE | Admit: 2015-09-30 | Discharge: 2015-09-30 | Disposition: A | Discharge status: Home / Self Care | Payer: 59 | Source: Ambulatory Visit | Attending: Cardiology | Admitting: Cardiology

## 2015-09-30 DIAGNOSIS — I252 Old myocardial infarction: Secondary | ICD-10-CM | POA: Diagnosis not present

## 2015-10-02 ENCOUNTER — Encounter (HOSPITAL_COMMUNITY)
Admission: RE | Admit: 2015-10-02 | Discharge: 2015-10-02 | Disposition: A | Discharge status: Home / Self Care | Payer: 59 | Source: Ambulatory Visit | Attending: Cardiology | Admitting: Cardiology

## 2015-10-02 DIAGNOSIS — I252 Old myocardial infarction: Secondary | ICD-10-CM | POA: Insufficient documentation

## 2015-10-02 DIAGNOSIS — Z955 Presence of coronary angioplasty implant and graft: Secondary | ICD-10-CM | POA: Insufficient documentation

## 2015-10-04 ENCOUNTER — Telehealth (HOSPITAL_COMMUNITY): Payer: Self-pay | Admitting: Family Medicine

## 2015-10-04 ENCOUNTER — Encounter (HOSPITAL_COMMUNITY): Payer: 59

## 2015-10-07 ENCOUNTER — Encounter (HOSPITAL_COMMUNITY)
Admission: RE | Admit: 2015-10-07 | Discharge: 2015-10-07 | Disposition: A | Discharge status: Home / Self Care | Payer: 59 | Source: Ambulatory Visit | Attending: Cardiology | Admitting: Cardiology

## 2015-10-07 DIAGNOSIS — I252 Old myocardial infarction: Secondary | ICD-10-CM | POA: Diagnosis not present

## 2015-10-09 ENCOUNTER — Encounter (HOSPITAL_COMMUNITY)
Admission: RE | Admit: 2015-10-09 | Discharge: 2015-10-09 | Disposition: A | Discharge status: Home / Self Care | Payer: 59 | Source: Ambulatory Visit | Attending: Cardiology | Admitting: Cardiology

## 2015-10-09 DIAGNOSIS — I252 Old myocardial infarction: Secondary | ICD-10-CM | POA: Diagnosis not present

## 2015-10-11 ENCOUNTER — Encounter (HOSPITAL_COMMUNITY)
Admission: RE | Admit: 2015-10-11 | Discharge: 2015-10-11 | Disposition: A | Discharge status: Home / Self Care | Payer: 59 | Source: Ambulatory Visit | Attending: Cardiology | Admitting: Cardiology

## 2015-10-11 ENCOUNTER — Telehealth: Payer: Self-pay | Admitting: Cardiology

## 2015-10-11 DIAGNOSIS — I252 Old myocardial infarction: Secondary | ICD-10-CM | POA: Diagnosis not present

## 2015-10-11 NOTE — Telephone Encounter (Signed)
New message       Pt want Dr Martinique to know that the lipid panel sent to him from Dr Jossie Ng office stated that she was not fasting.  She says she was fasting and wanted the doctor to know.

## 2015-10-11 NOTE — Telephone Encounter (Signed)
Spoke with pt, aware will make dr Martinique aware.

## 2015-10-14 ENCOUNTER — Encounter (HOSPITAL_COMMUNITY)
Admission: RE | Admit: 2015-10-14 | Discharge: 2015-10-14 | Disposition: A | Discharge status: Home / Self Care | Payer: 59 | Source: Ambulatory Visit | Attending: Cardiology | Admitting: Cardiology

## 2015-10-14 DIAGNOSIS — I252 Old myocardial infarction: Secondary | ICD-10-CM | POA: Diagnosis not present

## 2015-10-16 ENCOUNTER — Encounter (HOSPITAL_COMMUNITY)
Admission: RE | Admit: 2015-10-16 | Discharge: 2015-10-16 | Disposition: A | Discharge status: Home / Self Care | Payer: 59 | Source: Ambulatory Visit | Attending: Cardiology | Admitting: Cardiology

## 2015-10-16 DIAGNOSIS — I252 Old myocardial infarction: Secondary | ICD-10-CM | POA: Diagnosis not present

## 2015-10-16 NOTE — Progress Notes (Signed)
No psychosocial needs identfied, no interventions necessary.  Will continue to monitor.   Pt is exercising on her own at home. Pt demonstrates increased self confidence with her physical activity and is more comfortable knowing her limitations to avoid over exertion. Pt is planning to retire this year and this has helped alleviate some anxiety.

## 2015-10-17 ENCOUNTER — Telehealth: Payer: Self-pay | Admitting: Cardiology

## 2015-10-17 MED ORDER — ATORVASTATIN CALCIUM 80 MG PO TABS: 80.0000 mg | ORAL_TABLET | Freq: Every day | ORAL | Status: DC

## 2015-10-17 NOTE — Telephone Encounter (Signed)
Atorvastatin refilled  Metoprolol already refilled metoprolol succinate (TOPROL XL) 25 MG 24 hr tablet 45 tablet 3 08/06/2015      Sig - Route: Take 0.5 tablets (12.5 mg total) by mouth daily. - Oral    E-Prescribing Status: Receipt confirmed by pharmacy (08/06/2015 10:26 AM EST)     Pharmacy    [id number] - [place], [place] - [address]

## 2015-10-17 NOTE — Telephone Encounter (Signed)
°*  STAT* If patient is at the pharmacy, call can be transferred to refill team.   1. Which medications need to be refilled? (please list name of each medication and dose if known) Metoprolol Succinate 25mg , Atorvastatin 80mg   2. Which pharmacy/location (including street and city if local pharmacy) is [facility] [address] [facility] [place]   3. Do they need a 30 day or 90 day supply? [place]

## 2015-10-18 ENCOUNTER — Encounter (HOSPITAL_COMMUNITY)
Admission: RE | Admit: 2015-10-18 | Discharge: 2015-10-18 | Disposition: A | Discharge status: Home / Self Care | Payer: 59 | Source: Ambulatory Visit | Attending: Cardiology | Admitting: Cardiology

## 2015-10-18 DIAGNOSIS — I252 Old myocardial infarction: Secondary | ICD-10-CM | POA: Diagnosis not present

## 2015-10-21 ENCOUNTER — Encounter (HOSPITAL_COMMUNITY)
Admission: RE | Admit: 2015-10-21 | Discharge: 2015-10-21 | Disposition: A | Discharge status: Home / Self Care | Payer: 59 | Source: Ambulatory Visit | Attending: Cardiology | Admitting: Cardiology

## 2015-10-21 DIAGNOSIS — I252 Old myocardial infarction: Secondary | ICD-10-CM | POA: Diagnosis not present

## 2015-10-23 ENCOUNTER — Encounter (HOSPITAL_COMMUNITY)
Admission: RE | Admit: 2015-10-23 | Discharge: 2015-10-23 | Disposition: A | Discharge status: Home / Self Care | Payer: 59 | Source: Ambulatory Visit | Attending: Cardiology | Admitting: Cardiology

## 2015-10-23 DIAGNOSIS — I252 Old myocardial infarction: Secondary | ICD-10-CM | POA: Diagnosis not present

## 2015-10-25 ENCOUNTER — Telehealth (HOSPITAL_COMMUNITY): Payer: Self-pay | Admitting: Family Medicine

## 2015-10-25 ENCOUNTER — Encounter (HOSPITAL_COMMUNITY): Payer: 59

## 2015-10-25 ENCOUNTER — Telehealth: Payer: Self-pay | Admitting: Cardiology

## 2015-10-25 NOTE — Telephone Encounter (Signed)
Pt of Dr. Martinique - hx of NSTEMI, ACS, angina. - took NTG x1 last night.  Notes "Fine now, I had a really rough week".  Pt relays that her granddaughter was in hospital earlier in the week, & mother had 'incident' last night. When patient got home last night she was tired, stressed. Had throat and chest tightness. Denies pain or SOB. Notes the throat tightness is the concerning symptom for her.  She took 1 nitro last night at time of problem and felt better. She denies problems today. Notes she was supposed to inform physician whenever this happened.  Routed to Dr Stanford Breed ([facility]) for any advice.

## 2015-10-25 NOTE — Telephone Encounter (Signed)
Scheduled for 3/28 w instructions to call if new problems, go to ER if recurrent pain. Pt voiced understanding.

## 2015-10-25 NOTE — Telephone Encounter (Signed)
Kaitlyn Lozano is calling to report that she had to take a Nitro late evening and she is feeling better .   Thanks

## 2015-10-25 NOTE — Telephone Encounter (Signed)
Left msg for patient to call. 

## 2015-10-25 NOTE — Telephone Encounter (Signed)
If recurrent pain, needs ER eval; fu with pa next week Kirk Ruths

## 2015-10-28 ENCOUNTER — Encounter (HOSPITAL_COMMUNITY)
Admission: RE | Admit: 2015-10-28 | Discharge: 2015-10-28 | Disposition: A | Discharge status: Home / Self Care | Payer: 59 | Source: Ambulatory Visit | Attending: Cardiology | Admitting: Cardiology

## 2015-10-28 DIAGNOSIS — I252 Old myocardial infarction: Secondary | ICD-10-CM | POA: Diagnosis not present

## 2015-10-29 ENCOUNTER — Ambulatory Visit (INDEPENDENT_AMBULATORY_CARE_PROVIDER_SITE_OTHER): Payer: 59 | Admitting: Physician Assistant

## 2015-10-29 ENCOUNTER — Encounter: Payer: Self-pay | Admitting: Physician Assistant

## 2015-10-29 VITALS — BP 112/60 | HR 69 | Ht 63.75 in | Wt 141.2 lb

## 2015-10-29 DIAGNOSIS — I2583 Coronary atherosclerosis due to lipid rich plaque: Principal | ICD-10-CM

## 2015-10-29 DIAGNOSIS — I2510 Atherosclerotic heart disease of native coronary artery without angina pectoris: Secondary | ICD-10-CM | POA: Diagnosis not present

## 2015-10-29 DIAGNOSIS — R079 Chest pain, unspecified: Secondary | ICD-10-CM

## 2015-10-29 NOTE — Patient Instructions (Signed)
Your physician recommends that you keep the follow-up appointment with Dr Martinique on April 11th. No changes were made today in your therapy.

## 2015-10-29 NOTE — Progress Notes (Signed)
Patient ID: Kaitlyn Lozano, female   DOB: 11/21/1950, 65 y.o.   MRN: [id number]    Date:  10/29/2015   ID:  Kaitlyn Lozano, DOB 1951-02-21, MRN [id number]  PCP:  Kaitlyn Naas, MD  Primary Cardiologist:  Martinique  Chief Complaint  Patient presents with  . Follow-up    patient reports one episode of chest tightness, she relates it to stress/anxiety, patient took 1 NTG with relief, has not occured since. otherwise no complaints.     History of Present Illness: DECLYNN Lozano is a 64 y.o. female with a history of NSTEMI 07/23/15 w/ PCI LAD, groin hematoma, seen in office 12/23 for concerns, Doppler w/ no pseudo or fistula. Patient reports that last week she had a very stressful week. Her granddaughter was admitted with thrombocytopenia after taking Bactrim and she also had a stressful confrontation with her mother. She ended up developing some chest pain with radiation up into her throat which reminded her of her episode back in December when she needed PCI to the LAD. She took one sublingual nitroglycerin and it resolved. She has not had an episode since. She exercised at cardiac rehabilitation yesterday without any symptoms.    The patient currently denies nausea, vomiting, fever, chest pain, shortness of breath, orthopnea, dizziness, PND, cough, congestion, abdominal pain, hematochezia, melena, lower extremity edema, claudication.  Post-Intervention Diagram           Wt Readings from Last 3 Encounters:  10/29/15 141 lb 3.2 oz (64.048 kg)  08/26/15 140 lb (63.504 kg)  08/15/15 146 lb 9.7 oz (66.5 kg)     Past Medical History  Diagnosis Date  . Depression   . Depression   . Bronchitis     eosinophilic  . GERD (gastroesophageal reflux disease)   . Arthritis     OA  . CAD (coronary artery disease) 07/21/2015    a. 07/2015: 95% stenosis of LAD -->2.5 mm x 16 mm Synergy DES placed, 60% stenosis 1st Mrg, 55% stenosis 2nd Mrg, EF normal    Current Outpatient Prescriptions    Medication Sig Dispense Refill  . acetaminophen (TYLENOL) 650 MG CR tablet Take 650 mg by mouth every 8 (eight) hours as needed for pain.     Marland Kitchen AMBULATORY NON FORMULARY MEDICATION Take 90 mg by mouth 2 (two) times daily. Medication Name: BRILINTA 90 mg BID (TWILIGHT Research study provided do not fill)    . AMBULATORY NON FORMULARY MEDICATION Take 81 mg by mouth daily. Medication Name: ASA 81 mg Daily ( TWILIGHT research study provided)    . atorvastatin (LIPITOR) 80 MG tablet Take 1 tablet (80 mg total) by mouth daily at 6 PM. 90 tablet 1  . Calcium Citrate-Vitamin D (CITRACAL + D PO) Take 1 tablet by mouth 2 (two) times daily.    . Cholecalciferol (VITAMIN D PO) Take 1 tablet by mouth daily.    . citalopram (CELEXA) 20 MG tablet Take 20 mg by mouth daily.     . metoprolol succinate (TOPROL XL) 25 MG 24 hr tablet Take 0.5 tablets (12.5 mg total) by mouth daily. 45 tablet 3  . Multiple Vitamins-Minerals (MULTIVITAMIN WITH MINERALS) tablet Take 1 tablet by mouth daily. Centrum Silver    . nitroGLYCERIN (NITROSTAT) 0.4 MG SL tablet Place 1 tablet (0.4 mg total) under the tongue every 5 (five) minutes as needed for chest pain. 20 tablet 0  . Turmeric 500 MG CAPS Take 500 mg by mouth daily.    . vitamin B-12 (  CYANOCOBALAMIN) 1000 MCG tablet Take 1,000 mcg by mouth daily.    . vitamin C (ASCORBIC ACID) 500 MG tablet Take 500 mg by mouth daily.     No current facility-administered medications for this visit.    Allergies:    Allergies  Allergen Reactions  . Sulfa Antibiotics Rash  . Sulfonamide Derivatives Rash    Social History:  The patient  reports that she has quit smoking. She has never used smokeless tobacco. She reports that she drinks alcohol. She reports that she does not use illicit drugs.   Family history:   Family History  Problem Relation Age of Onset  . Atrial fibrillation Mother   . Transient ischemic attack Mother   . Hypertension Mother   . Stroke Mother   . Heart  failure Mother   . Heart failure Maternal Grandmother   . Heart attack Maternal Grandfather   . Aortic stenosis Father     ROS:  Please see the history of present illness.  All other systems reviewed and negative.   PHYSICAL EXAM: VS:  BP 112/60 mmHg  Pulse 69  Ht 5' 3.75" (1.619 m)  Wt 141 lb 3.2 oz (64.048 kg)  BMI 24.43 kg/m2 Well nourished, well developed, in no acute distress HEENT: Pupils are equal round react to light accommodation extraocular movements are intact.  Neck: no JVDNo cervical lymphadenopathy. Cardiac: Regular rate and rhythm without murmurs rubs or gallops. Lungs:  clear to auscultation bilaterally, no wheezing, rhonchi or rales Abd: soft, nontender, positive bowel sounds all quadrants, no hepatosplenomegaly Ext: no lower extremity edema.  2+ radial and dorsalis pedis pulses. Skin: warm and dry Neuro:  Grossly normal  EKG: normal sinus rhythm rate 69 bpm    ASSESSMENT AND PLAN:  Problem List Items Addressed This Visit    Coronary artery disease due to lipid rich plaque - Primary   Relevant Orders   EKG 12-Lead   Chest pain     Mrs. Merryman reports having chest pain on March 24. She took a nitroglycerin and it resolved. She's had no episodes since and she exercised at cardiac rehabilitation yesterday.  EKG shows no ischemic changes.  Suspect her chest pain is related to the stress of last week with her granddaughter and her mother. Perhaps she was having some vasospasm which precipitated. Since he has not had a recurrence particularly with exercise, I do not think further evaluation is required.her blood pressure is currently well controlled.  She is enrolled in the TWILIGHT study.

## 2015-10-30 ENCOUNTER — Other Ambulatory Visit: Payer: Self-pay | Admitting: *Deleted

## 2015-10-30 ENCOUNTER — Encounter (HOSPITAL_COMMUNITY)
Admission: RE | Admit: 2015-10-30 | Discharge: 2015-10-30 | Disposition: A | Discharge status: Home / Self Care | Payer: 59 | Source: Ambulatory Visit | Attending: Cardiology | Admitting: Cardiology

## 2015-10-30 ENCOUNTER — Encounter: Payer: Self-pay | Admitting: *Deleted

## 2015-10-30 DIAGNOSIS — I252 Old myocardial infarction: Secondary | ICD-10-CM | POA: Diagnosis not present

## 2015-10-30 DIAGNOSIS — Z006 Encounter for examination for normal comparison and control in clinical research program: Secondary | ICD-10-CM

## 2015-10-30 MED ORDER — AMBULATORY NON FORMULARY MEDICATION: 81.0000 mg | Freq: Every day | Status: DC

## 2015-10-30 NOTE — Progress Notes (Signed)
TWILIGHT research study 3 month RANDOMIZATION visit completed. Patient states no adverse or bleeding events and 100% compliant with Brilinta and ASA. Instructed patient NO open label ASA from today until end of study. Research follow up visit window given to patient. Questions encouraged and answered. Dispensed Brilinta bottle #'s T [id number]; T [id number] & study drug bottle # [id number].

## 2015-11-01 ENCOUNTER — Encounter (HOSPITAL_COMMUNITY)
Admission: RE | Admit: 2015-11-01 | Discharge: 2015-11-01 | Disposition: A | Discharge status: Home / Self Care | Payer: 59 | Source: Ambulatory Visit | Attending: Cardiology | Admitting: Cardiology

## 2015-11-01 DIAGNOSIS — I252 Old myocardial infarction: Secondary | ICD-10-CM | POA: Diagnosis not present

## 2015-11-04 ENCOUNTER — Encounter (HOSPITAL_COMMUNITY)
Admission: RE | Admit: 2015-11-04 | Discharge: 2015-11-04 | Disposition: A | Discharge status: Home / Self Care | Payer: 59 | Source: Ambulatory Visit | Attending: Cardiology | Admitting: Cardiology

## 2015-11-04 DIAGNOSIS — Z955 Presence of coronary angioplasty implant and graft: Secondary | ICD-10-CM | POA: Insufficient documentation

## 2015-11-04 DIAGNOSIS — I252 Old myocardial infarction: Secondary | ICD-10-CM | POA: Diagnosis present

## 2015-11-06 ENCOUNTER — Encounter (HOSPITAL_COMMUNITY)
Admission: RE | Admit: 2015-11-06 | Discharge: 2015-11-06 | Disposition: A | Discharge status: Home / Self Care | Payer: 59 | Source: Ambulatory Visit | Attending: Cardiology | Admitting: Cardiology

## 2015-11-06 DIAGNOSIS — I252 Old myocardial infarction: Secondary | ICD-10-CM | POA: Diagnosis not present

## 2015-11-08 ENCOUNTER — Encounter: Payer: Self-pay | Admitting: *Deleted

## 2015-11-08 ENCOUNTER — Encounter (HOSPITAL_COMMUNITY)
Admission: RE | Admit: 2015-11-08 | Discharge: 2015-11-08 | Disposition: A | Discharge status: Home / Self Care | Payer: 59 | Source: Ambulatory Visit | Attending: Cardiology | Admitting: Cardiology

## 2015-11-08 DIAGNOSIS — I252 Old myocardial infarction: Secondary | ICD-10-CM | POA: Diagnosis not present

## 2015-11-11 ENCOUNTER — Encounter (HOSPITAL_COMMUNITY): Payer: 59

## 2015-11-12 ENCOUNTER — Ambulatory Visit: Payer: 59 | Admitting: Cardiology

## 2015-11-12 ENCOUNTER — Encounter: Payer: Self-pay | Admitting: Cardiology

## 2015-11-12 ENCOUNTER — Ambulatory Visit (INDEPENDENT_AMBULATORY_CARE_PROVIDER_SITE_OTHER): Payer: 59 | Admitting: Cardiology

## 2015-11-12 VITALS — BP 114/62 | HR 79 | Ht 63.0 in | Wt 139.0 lb

## 2015-11-12 DIAGNOSIS — E785 Hyperlipidemia, unspecified: Secondary | ICD-10-CM | POA: Diagnosis not present

## 2015-11-12 DIAGNOSIS — I25110 Atherosclerotic heart disease of native coronary artery with unstable angina pectoris: Secondary | ICD-10-CM | POA: Diagnosis not present

## 2015-11-12 MED ORDER — ATORVASTATIN CALCIUM 80 MG PO TABS: 40.0000 mg | ORAL_TABLET | Freq: Every day | ORAL | Status: DC

## 2015-11-12 NOTE — Progress Notes (Signed)
CARDIOLOGY OFFICE NOTE  Date:  11/12/2015    Kaitlyn Lozano Date of Birth: [birth year] Medical Record [id number]  PCP:  Reginia Naas, MD  Cardiologist:  Taliah Porche Martinique  MD  Chief Complaint  Patient presents with  . Follow-up    Cardiac Rehab  pt states no Sx.    History of Present Illness: Kaitlyn Lozano is a 65 y.o. female who is seen for follow up CAD.   She presented to [facility] ED on 07/21/2015 for new-onset chest pain. LHC on 07/23/2015 showed 95% stenosis of the Mid-LAD. PCI performed with DES.   On follow up today she is doing well. Was seen at the end of March with atypical chest pain. She had multiple stressors at the time and this has since resolved. She is following a ZONE diet and reports a 10 lbs weight loss. Feels very well. No chest pain or SOB. Completing Cardiac Rehab next week. She is enrolled in the TWILIGHT trial.   Past Medical History  Diagnosis Date  . Depression   . Depression   . Bronchitis     eosinophilic  . GERD (gastroesophageal reflux disease)   . Arthritis     OA  . CAD (coronary artery disease) 07/21/2015    a. 07/2015: 95% stenosis of LAD -->2.5 mm x 16 mm Synergy DES placed, 60% stenosis 1st Mrg, 55% stenosis 2nd Mrg, EF normal    Past Surgical History  Procedure Laterality Date  . Dnc    . Dilation and curettage of uterus  1993-1994  . Hernia repair    . Cardiac catheterization N/A 07/23/2015    Procedure: Left Heart Cath and Coronary Angiography;  Surgeon: Leonie Man, MD;  Location: [place] [facility];  Service: Cardiovascular;  Laterality: N/A;  . Cardiac catheterization N/A 07/23/2015    Procedure: Coronary Stent Intervention;  Surgeon: Leonie Man, MD;  Location: [place] [facility];  Service: Cardiovascular;  Laterality: N/A;     Medications: Current Outpatient Prescriptions  Medication Sig Dispense Refill  . acetaminophen (TYLENOL) 650 MG CR tablet Take 650 mg by mouth every 8 (eight) hours as needed for  pain.     Marland Kitchen AMBULATORY NON FORMULARY MEDICATION Take 90 mg by mouth 2 (two) times daily. Medication Name: BRILINTA 90 mg BID (TWILIGHT Research study provided do not fill)    . AMBULATORY NON FORMULARY MEDICATION Take 81 mg by mouth daily. Medication Name: ASA 81 mg Daily or PLACEBO (TWILIGHT Research study provided)    . atorvastatin (LIPITOR) 80 MG tablet Take 0.5 tablets (40 mg total) by mouth daily at 6 PM. 90 tablet 1  . Calcium Citrate-Vitamin D (CITRACAL + D PO) Take 1 tablet by mouth 2 (two) times daily.    . Cholecalciferol (VITAMIN D PO) Take 1 tablet by mouth daily.    . citalopram (CELEXA) 20 MG tablet Take 20 mg by mouth daily.     . metoprolol succinate (TOPROL XL) 25 MG 24 hr tablet Take 0.5 tablets (12.5 mg total) by mouth daily. 45 tablet 3  . Multiple Vitamins-Minerals (MULTIVITAMIN WITH MINERALS) tablet Take 1 tablet by mouth daily. Centrum Silver    . nitroGLYCERIN (NITROSTAT) 0.4 MG SL tablet Place 1 tablet (0.4 mg total) under the tongue every 5 (five) minutes as needed for chest pain. 20 tablet 0  . Turmeric 500 MG CAPS Take 500 mg by mouth daily.    . vitamin B-12 (CYANOCOBALAMIN) 1000 MCG tablet Take 1,000 mcg  by mouth daily.    . vitamin C (ASCORBIC ACID) 500 MG tablet Take 500 mg by mouth daily.     No current facility-administered medications for this visit.    Allergies: Allergies  Allergen Reactions  . Sulfa Antibiotics Rash  . Sulfonamide Derivatives Rash    Social History: The patient  reports that she has quit smoking. She has never used smokeless tobacco. She reports that she drinks alcohol. She reports that she does not use illicit drugs.   Family History: The patient's family history includes Aortic stenosis in her father; Atrial fibrillation in her mother; Clotting disorder in her sister; Healthy in her brother and sister; Heart attack in her maternal grandfather; Heart failure in her maternal grandmother and mother; Hyperlipidemia in her father;  Hypertension in her mother; Macular degeneration in her mother; Skin cancer in her mother; Stroke in her mother; Transient ischemic attack in her mother.   Review of Systems: Please see the history of present illness.   Otherwise, the review of systems is positive for none.   All other systems are reviewed and negative.   Physical Exam: VS:  BP 114/62 mmHg  Pulse 79  Ht 5\' 3"  (1.6 m)  Wt 63.05 kg (139 lb)  BMI 24.63 kg/m2 .  BMI Body mass index is 24.63 kg/(m^2).  Wt Readings from Last 3 Encounters:  11/12/15 63.05 kg (139 lb)  10/29/15 64.048 kg (141 lb 3.2 oz)  08/26/15 63.504 kg (140 lb)    General: Pleasant. Well developed, well nourished and in no acute distress.  HEENT: Normal. Neck: Supple, no JVD, carotid bruits, or masses noted.  Cardiac: Regular rate and rhythm. No murmurs, rubs, or gallops. No edema.  Respiratory:  Lungs are clear to auscultation bilaterally with normal work of breathing.  GI: Soft and nontender.  MS: No deformity or atrophy. Gait and ROM intact. Skin: Warm and dry. Color is normal.  Neuro:  Strength and sensation are intact and no gross focal deficits noted.  Psych: Alert, appropriate and with normal affect.  Right groin with diffuse ecchymosis and a 50 cent size knot - I cannot appreciate a bruit. It is tender to touch.    LABORATORY DATA:  EKG:  EKG is not ordered today.  Lab Results  Component Value Date   WBC 8.0 08/26/2015   HGB 13.9 08/26/2015   HCT 39.1 08/26/2015   PLT 350 08/26/2015   GLUCOSE 77 08/26/2015   CHOL 152 07/22/2015   TRIG 112 07/22/2015   HDL 61 07/22/2015   LDLCALC 69 07/22/2015   NA 142 08/26/2015   K 4.1 08/26/2015   CL 102 08/26/2015   CREATININE 0.97 08/26/2015   BUN 18 08/26/2015   CO2 24 08/26/2015   INR 1.19 07/23/2015    BNP (last 3 results) No results for input(s): BNP in the last 8760 hours.  ProBNP (last 3 results) No results for input(s): PROBNP in the last 8760 hours.   Other Studies  Reviewed Today:  Labs from 10/04/15: Cholesterol 88, trig 58, HDL 45, LDL 31.      Assessment/Plan: 1. NSTEMI - s/p PCI to the mid LAD with DES - she is in the TWILIGHT study -   - Will need to be on ASA and Brilinta for a minimum of 3 months, after which time she can stop ASA and continue Brilinta per study protocol.   2.   HLD - on statin therapy with excellent response. Will reduce lipitor to 40 mg daily.  Current medicines are reviewed with the patient today.  The patient does not have concerns regarding medicines other than what has been noted above.  The following changes have been made:  See above.  Labs/ tests ordered today include:   No orders of the defined types were placed in this encounter.     Disposition:   FU with me in 6 months  Patient is agreeable to this plan and will call if any problems develop in the interim.   Signed: Altha Sweitzer Martinique MD, [facility]    11/12/2015 11:43 AM  [place]

## 2015-11-12 NOTE — Patient Instructions (Signed)
Reduce lipitor to 40 mg daily  Continue your other therapy  I will see you in 6 months.

## 2015-11-13 ENCOUNTER — Encounter (HOSPITAL_COMMUNITY)
Admission: RE | Admit: 2015-11-13 | Discharge: 2015-11-13 | Disposition: A | Discharge status: Home / Self Care | Payer: 59 | Source: Ambulatory Visit | Attending: Cardiology | Admitting: Cardiology

## 2015-11-13 DIAGNOSIS — I252 Old myocardial infarction: Secondary | ICD-10-CM | POA: Diagnosis not present

## 2015-11-15 ENCOUNTER — Encounter (HOSPITAL_COMMUNITY)
Admission: RE | Admit: 2015-11-15 | Discharge: 2015-11-15 | Disposition: A | Discharge status: Home / Self Care | Payer: 59 | Source: Ambulatory Visit | Attending: Cardiology | Admitting: Cardiology

## 2015-11-15 DIAGNOSIS — I252 Old myocardial infarction: Secondary | ICD-10-CM | POA: Diagnosis not present

## 2015-11-15 NOTE — Progress Notes (Signed)
No psychosocial needs identified, no intervention necessary. Pt is exercising on her own at home.  Will continue to monitor.  Pt is successfully managing her health related stress with positive coping strategies and demonstrates positive outlook.  Pt has supported family.

## 2015-11-18 ENCOUNTER — Encounter (HOSPITAL_COMMUNITY): Payer: Self-pay

## 2015-11-18 ENCOUNTER — Encounter (HOSPITAL_COMMUNITY)
Admission: RE | Admit: 2015-11-18 | Discharge: 2015-11-18 | Disposition: A | Discharge status: Home / Self Care | Payer: 59 | Source: Ambulatory Visit | Attending: Cardiology | Admitting: Cardiology

## 2015-11-18 DIAGNOSIS — I252 Old myocardial infarction: Secondary | ICD-10-CM | POA: Diagnosis not present

## 2015-11-18 NOTE — Progress Notes (Signed)
Pt graduated from cardiac rehab program today with completion of 36 exercise sessions in Phase II. Pt maintained good attendance and progressed nicely during her participation in rehab as evidenced by increased MET level.   Medication list reconciled. Repeat  PHQ score- 0.  This is decreased from initial score.  Pt has less health related stress and situational job stress.  Pt admits she now realizes how poorly she felt prior to her cardiac intervention.  She feels she has regained her strength and stamina.   Pt has made significant lifestyle changes and should be commended for her success. Pt feels she has achieved her goals during cardiac rehab.   Pt plans to continue exercising on her own at home.

## 2015-11-20 ENCOUNTER — Encounter (HOSPITAL_COMMUNITY): Payer: 59

## 2015-11-22 ENCOUNTER — Encounter (HOSPITAL_COMMUNITY): Payer: 59

## 2015-11-25 ENCOUNTER — Encounter (HOSPITAL_COMMUNITY): Payer: 59

## 2015-11-26 ENCOUNTER — Telehealth: Payer: Self-pay | Admitting: *Deleted

## 2015-11-26 NOTE — Telephone Encounter (Signed)
TWILIGHT Research telephone 4 month follow up completed. Patient denies any problems, stated she did wake up with small amount of blood in her ear. She thinks it may have been a scab but nothing major. States compliant with medication. Questions encouraged and answered.

## 2015-12-20 ENCOUNTER — Encounter: Payer: Self-pay | Admitting: Cardiology

## 2015-12-27 ENCOUNTER — Other Ambulatory Visit: Payer: Self-pay | Admitting: *Deleted

## 2015-12-27 MED ORDER — METOPROLOL SUCCINATE ER 25 MG PO TB24: 12.5000 mg | ORAL_TABLET | Freq: Every day | ORAL | Status: DC

## 2015-12-27 MED ORDER — NITROGLYCERIN 0.4 MG SL SUBL: 0.4000 mg | SUBLINGUAL_TABLET | SUBLINGUAL | Status: DC | PRN

## 2015-12-27 MED ORDER — ATORVASTATIN CALCIUM 80 MG PO TABS: 40.0000 mg | ORAL_TABLET | Freq: Every day | ORAL | Status: DC

## 2015-12-31 ENCOUNTER — Other Ambulatory Visit: Payer: Self-pay

## 2015-12-31 MED ORDER — METOPROLOL SUCCINATE ER 25 MG PO TB24: 12.5000 mg | ORAL_TABLET | Freq: Every day | ORAL | Status: DC

## 2015-12-31 MED ORDER — NITROGLYCERIN 0.4 MG SL SUBL: 0.4000 mg | SUBLINGUAL_TABLET | SUBLINGUAL | Status: DC | PRN

## 2015-12-31 MED ORDER — ATORVASTATIN CALCIUM 80 MG PO TABS: 40.0000 mg | ORAL_TABLET | Freq: Every day | ORAL | Status: DC

## 2016-03-23 ENCOUNTER — Other Ambulatory Visit: Payer: Self-pay | Admitting: Family Medicine

## 2016-03-23 DIAGNOSIS — Z1231 Encounter for screening mammogram for malignant neoplasm of breast: Secondary | ICD-10-CM

## 2016-03-25 ENCOUNTER — Ambulatory Visit
Admission: RE | Admit: 2016-03-25 | Discharge: 2016-03-25 | Disposition: A | Discharge status: Home / Self Care | Payer: Medicare Other | Source: Ambulatory Visit | Attending: Family Medicine | Admitting: Family Medicine

## 2016-03-25 DIAGNOSIS — Z1231 Encounter for screening mammogram for malignant neoplasm of breast: Secondary | ICD-10-CM

## 2016-04-16 ENCOUNTER — Encounter: Payer: Self-pay | Admitting: *Deleted

## 2016-04-16 DIAGNOSIS — Z006 Encounter for examination for normal comparison and control in clinical research program: Secondary | ICD-10-CM

## 2016-04-16 NOTE — Progress Notes (Signed)
TWILIGHT Research study month 9 visit completed. She states she has had 1 bleeding episode where she cut her knee shaving and it took hours to stop bleeding she did not seek medical attention. She has been compliant 100% (after pill count) with her Brilinta and ASA/Placebo. Her next visit window is 09/15/16-11/14/16 this will be her last treatment visit and she will be returning all study provided medication. Further antiplatelet therapy will be at the discretion of her cardiologist. Questions encouraged and answered.

## 2016-05-21 NOTE — Progress Notes (Signed)
CARDIOLOGY OFFICE NOTE  Date:  05/22/2016    [name] Date of Birth: Sep 01, 1950 Medical Record [id number]  PCP:  Reginia Naas, MD  Cardiologist:  Jacqualin Shirkey Martinique  MD  Chief Complaint  Patient presents with  . Follow-up  . Coronary Artery Disease    History of Present Illness: Kaitlyn Lozano is a 65 y.o. female who is seen for follow up CAD.   She presented to [facility] ED on 07/21/2015 for new-onset chest pain. LHC on 07/23/2015 showed 95% stenosis of the Mid-LAD. PCI performed with DES.   On follow up today she is doing well.  She is following an anti-inflammatory  diet and has lost an additional 15 lb. Feels very well. No chest pain or SOB. Walks 1.5 miles/day.  She is enrolled in the TWILIGHT trial. She does complain of a lot of hip pain bilateral. This preceded her being on lipitor but she is concerned the lipitor may be making it worse.   Past Medical History:  Diagnosis Date  . Arthritis    OA  . Bronchitis    eosinophilic  . CAD (coronary artery disease) 07/21/2015   a. 07/2015: 95% stenosis of LAD -->2.5 mm x 16 mm Synergy DES placed, 60% stenosis 1st Mrg, 55% stenosis 2nd Mrg, EF normal  . Depression   . Depression   . GERD (gastroesophageal reflux disease)     Past Surgical History:  Procedure Laterality Date  . CARDIAC CATHETERIZATION N/A 07/23/2015   Procedure: Left Heart Cath and Coronary Angiography;  Surgeon: Leonie Man, MD;  Location: [place] [facility];  Service: Cardiovascular;  Laterality: N/A;  . CARDIAC CATHETERIZATION N/A 07/23/2015   Procedure: Coronary Stent Intervention;  Surgeon: Leonie Man, MD;  Location: [place] [facility];  Service: Cardiovascular;  Laterality: N/A;  . DILATION AND CURETTAGE OF UTERUS  [phone]  . dnc    . HERNIA REPAIR       Medications: Current Outpatient Prescriptions  Medication Sig Dispense Refill  . acetaminophen (TYLENOL) 650 MG CR tablet Take 1,300 mg by mouth 2 (two) times daily.       . AMBULATORY NON FORMULARY MEDICATION Take 90 mg by mouth 2 (two) times daily. Medication Name: BRILINTA 90 mg BID (TWILIGHT Research study provided do not fill)    . AMBULATORY NON FORMULARY MEDICATION Take 81 mg by mouth daily. Medication Name: ASA 81 mg Daily or PLACEBO (TWILIGHT Research study provided)    . Calcium Citrate-Vitamin D (CITRACAL + D PO) Take 1 tablet by mouth 2 (two) times daily.    . Cholecalciferol (VITAMIN D PO) Take 1 tablet by mouth daily.    . citalopram (CELEXA) 20 MG tablet Take 20 mg by mouth daily.     . metoprolol succinate (TOPROL XL) 25 MG 24 hr tablet Take 0.5 tablets (12.5 mg total) by mouth daily. 90 tablet 1  . Multiple Vitamins-Minerals (MULTIVITAMIN WITH MINERALS) tablet Take 1 tablet by mouth daily. Centrum Silver    . nitroGLYCERIN (NITROSTAT) 0.4 MG SL tablet Place 1 tablet (0.4 mg total) under the tongue every 5 (five) minutes as needed for chest pain. 20 tablet 5  . Turmeric 500 MG CAPS Take 1,000 mg by mouth daily.     . vitamin B-12 (CYANOCOBALAMIN) 1000 MCG tablet Take 1,000 mcg by mouth daily.    . vitamin C (ASCORBIC ACID) 500 MG tablet Take 500 mg by mouth daily.    Marland Kitchen atorvastatin (LIPITOR) 10 MG tablet  Take 1 tablet (10 mg total) by mouth daily. 90 tablet 3   No current facility-administered medications for this visit.     Allergies: Allergies  Allergen Reactions  . Sulfa Antibiotics Rash  . Sulfonamide Derivatives Rash    Social History: The patient  reports that she has quit smoking. She has never used smokeless tobacco. She reports that she drinks alcohol. She reports that she does not use drugs.   Family History: The patient's family history includes Aortic stenosis in her father; Atrial fibrillation in her mother; Clotting disorder in her sister; Healthy in her brother and sister; Heart attack in her maternal grandfather; Heart failure in her maternal grandmother and mother; Hyperlipidemia in her father; Hypertension in her mother;  Macular degeneration in her mother; Skin cancer in her mother; Stroke in her mother; Transient ischemic attack in her mother.   Review of Systems: Please see the history of present illness.   Otherwise, the review of systems is positive for none.   All other systems are reviewed and negative.   Physical Exam: VS:  BP 112/68 (BP Location: Left Arm, Patient Position: Sitting, Cuff Size: Normal)   Pulse 72   Ht 5\' 3"  (1.6 m)   Wt 124 lb 12.8 oz (56.6 kg)   BMI 22.11 kg/m  .  BMI Body mass index is 22.11 kg/m.  Wt Readings from Last 3 Encounters:  05/22/16 124 lb 12.8 oz (56.6 kg)  11/12/15 139 lb (63 kg)  10/29/15 141 lb 3.2 oz (64 kg)    General: Pleasant. Well developed, well nourished and in no acute distress.   HEENT: Normal.  Neck: Supple, no JVD, carotid bruits, or masses noted.  Cardiac: Regular rate and rhythm. No murmurs, rubs, or gallops. No edema.  Respiratory:  Lungs are clear to auscultation bilaterally with normal work of breathing.  GI: Soft and nontender.  MS: No deformity or atrophy. Gait and ROM intact.  Skin: Warm and dry. Color is normal.  Neuro:  Strength and sensation are intact and no gross focal deficits noted.  Psych: Alert, appropriate and with normal affect.  Right groin with diffuse ecchymosis and a 50 cent size knot - I cannot appreciate a bruit. It is tender to touch.    LABORATORY DATA:  EKG:  EKG is not ordered today.  Lab Results  Component Value Date   WBC 8.0 08/26/2015   HGB 13.9 08/26/2015   HCT 39.1 08/26/2015   PLT 350 08/26/2015   GLUCOSE 77 08/26/2015   CHOL 152 07/22/2015   TRIG 112 07/22/2015   HDL 61 07/22/2015   LDLCALC 69 07/22/2015   NA 142 08/26/2015   K 4.1 08/26/2015   CL 102 08/26/2015   CREATININE 0.97 08/26/2015   BUN 18 08/26/2015   CO2 24 08/26/2015   INR 1.19 07/23/2015    BNP (last 3 results) No results for input(s): BNP in the last 8760 hours.  ProBNP (last 3 results) No results for input(s): PROBNP in  the last 8760 hours.   Other Studies Reviewed Today:  Labs from 10/04/15: Cholesterol 88, trig 58, HDL 45, LDL 31.      Assessment/Plan: 1. NSTEMI - s/p PCI to the mid LAD with DES - she is in the TWILIGHT study. Anticipate change to ASA 81 mg daily at one year.   2.   HLD - on statin therapy with excellent response. Will reduce lipitor to 10 mg daily. Repeat lab work in 2 months. We will see if arthralgias  improve.   Current medicines are reviewed with the patient today.  The patient does not have concerns regarding medicines other than what has been noted above.  The following changes have been made:  See above.  Labs/ tests ordered today include:    Orders Placed This Encounter  Procedures  . Basic metabolic panel  . Lipid panel  . Hepatic function panel     Disposition:   FU with me in 6 months  Patient is agreeable to this plan and will call if any problems develop in the interim.   Signed: Lealon Vanputten Martinique MD, [facility]    05/22/2016 9:38 AM  [facility]

## 2016-05-22 ENCOUNTER — Ambulatory Visit (INDEPENDENT_AMBULATORY_CARE_PROVIDER_SITE_OTHER): Payer: Medicare Other | Admitting: Cardiology

## 2016-05-22 ENCOUNTER — Encounter: Payer: Self-pay | Admitting: Cardiology

## 2016-05-22 VITALS — BP 112/68 | HR 72 | Ht 63.0 in | Wt 124.8 lb

## 2016-05-22 DIAGNOSIS — I2583 Coronary atherosclerosis due to lipid rich plaque: Secondary | ICD-10-CM

## 2016-05-22 DIAGNOSIS — I2510 Atherosclerotic heart disease of native coronary artery without angina pectoris: Secondary | ICD-10-CM | POA: Diagnosis not present

## 2016-05-22 MED ORDER — ATORVASTATIN CALCIUM 10 MG PO TABS: 10.0000 mg | ORAL_TABLET | Freq: Every day | ORAL | 3 refills | Status: DC

## 2016-05-22 NOTE — Patient Instructions (Signed)
We will reduce lipitor to 10 mg daily  We will repeat lab work in 2 months.  I will see you in 6 months.

## 2016-07-22 ENCOUNTER — Telehealth: Payer: Self-pay

## 2016-07-22 LAB — LIPID PANEL
Cholesterol: 104 mg/dL (ref <200)
HDL: 59 mg/dL (ref >50)
LDL CALC: 36 mg/dL (ref <100)
TRIGLYCERIDES: 47 mg/dL (ref <150)
Total CHOL/HDL Ratio: 1.8 Ratio (ref <5.0)
VLDL: 9 mg/dL (ref <30)

## 2016-07-22 LAB — HEPATIC FUNCTION PANEL
ALBUMIN: 4.2 g/dL (ref 3.6–5.1)
ALK PHOS: 52 U/L (ref 33–130)
ALT: 8 U/L (ref 6–29)
AST: 17 U/L (ref 10–35)
BILIRUBIN INDIRECT: 0.5 mg/dL (ref 0.2–1.2)
BILIRUBIN TOTAL: 0.7 mg/dL (ref 0.2–1.2)
Bilirubin, Direct: 0.2 mg/dL (ref <=0.2)
Total Protein: 5.8 g/dL — ABNORMAL LOW (ref 6.1–8.1)

## 2016-07-22 LAB — BASIC METABOLIC PANEL
BUN: 21 mg/dL (ref 7–25)
CALCIUM: 9 mg/dL (ref 8.6–10.4)
CHLORIDE: 107 mmol/L (ref 98–110)
CO2: 26 mmol/L (ref 20–31)
CREATININE: 0.74 mg/dL (ref 0.50–0.99)
Glucose, Bld: 80 mg/dL (ref 65–99)
Potassium: 5.2 mmol/L (ref 3.5–5.3)
Sodium: 141 mmol/L (ref 135–146)

## 2016-07-22 NOTE — Telephone Encounter (Signed)
With her severe coronary stenosis and stent she needs to be on a statin.  Peter Martinique MD, [facility]

## 2016-07-22 NOTE — Telephone Encounter (Signed)
Received a call from patient.She stated she wanted to ask Dr.Jordan if she needs to continue Atorvastatin 10 mg daily.Stated she never had high cholesterol before she started taking.I will send message to Cecilia for advice.

## 2016-07-23 NOTE — Telephone Encounter (Signed)
Received a call back from patient.She stated she forgot to mention after decreasing atorvastatin to 10 mg daily 2 months ago she continues to have joint pain.Advised to stop taking.Message sent to Dixon.

## 2016-07-23 NOTE — Telephone Encounter (Signed)
Returned call to patient no answer.LMTC. 

## 2016-07-26 NOTE — Telephone Encounter (Signed)
Agree stop taking lipitor for one month. If joint pain resolves let us know and we will consider alternative therapy. If joint pain does not resolve then it is not the lipitor and she should resume.  Kaitlyn Riccobono Martinique MD, [facility]

## 2016-07-29 NOTE — Telephone Encounter (Signed)
Spoke with pt, aware of dr Doug Sou recommendations. She will let us know.

## 2016-08-27 ENCOUNTER — Telehealth: Payer: Self-pay | Admitting: Cardiology

## 2016-08-27 NOTE — Telephone Encounter (Signed)
Patient advised to avoid decongestants due to risk of BP and/or HR elevation. Recommended zyrtec or claritin for nasal congestion, coricidin for other symptoms. Pt voiced understanding and thanks.

## 2016-08-27 NOTE — Telephone Encounter (Signed)
New message       Pt is on metoprolol.  Can she take mucinex DM and a decongestant for her cold?  Please call

## 2016-08-30 ENCOUNTER — Ambulatory Visit (HOSPITAL_COMMUNITY)
Admission: EM | Admit: 2016-08-30 | Discharge: 2016-08-30 | Disposition: A | Discharge status: Home / Self Care | Payer: Medicare Other | Attending: Family Medicine | Admitting: Family Medicine

## 2016-08-30 ENCOUNTER — Encounter (HOSPITAL_COMMUNITY): Payer: Self-pay

## 2016-08-30 DIAGNOSIS — J209 Acute bronchitis, unspecified: Secondary | ICD-10-CM

## 2016-08-30 MED ORDER — AMOXICILLIN-POT CLAVULANATE 875-125 MG PO TABS: 1.0000 | ORAL_TABLET | Freq: Two times a day (BID) | ORAL | 0 refills | Status: AC

## 2016-08-30 NOTE — ED Triage Notes (Signed)
Pt said cough, sore throat, nasal discharge, sneezing for 1 week. No fever. Using saline spray and mucinex DM

## 2016-08-30 NOTE — Discharge Instructions (Signed)
Recommend start Augmentin twice a day as directed. May use Delsym at night to help with cough. Avoid decongestants- may continue nasal saline spray as needed. Follow-up with your primary care provider in 3 days if not improving.

## 2016-08-30 NOTE — ED Provider Notes (Signed)
CSN: [id number]     Arrival date & time 08/30/16  1212 History   First MD Initiated Contact with Patient 08/30/16 1404     Chief Complaint  Patient presents with  . Cough   (Consider location/radiation/quality/duration/timing/severity/associated sxs/prior Treatment) 66 year old female presents with sore throat, nasal congestion and cough that started over 2 weeks ago. Denies any fever or GI symptoms. Nasal congestion has improved but still a lot of sinus pressure and cough has gotten worse. Has tried saline nasal spray and Mucinex DM with minimal relief. Has history of MI and has a stent but no history of HTN or hyperlipidemia.    The history is provided by the patient.    Past Medical History:  Diagnosis Date  . Arthritis    OA  . Bronchitis    eosinophilic  . CAD (coronary artery disease) 07/21/2015   a. 07/2015: 95% stenosis of LAD -->2.5 mm x 16 mm Synergy DES placed, 60% stenosis 1st Mrg, 55% stenosis 2nd Mrg, EF normal  . Depression   . Depression   . GERD (gastroesophageal reflux disease)    Past Surgical History:  Procedure Laterality Date  . CARDIAC CATHETERIZATION N/A 07/23/2015   Procedure: Left Heart Cath and Coronary Angiography;  Surgeon: Leonie Man, MD;  Location: [place] [facility];  Service: Cardiovascular;  Laterality: N/A;  . CARDIAC CATHETERIZATION N/A 07/23/2015   Procedure: Coronary Stent Intervention;  Surgeon: Leonie Man, MD;  Location: [place] [facility];  Service: Cardiovascular;  Laterality: N/A;  . DILATION AND CURETTAGE OF UTERUS  [phone]  . dnc    . HERNIA REPAIR     Family History  Problem Relation Age of Onset  . Aortic stenosis Father     Artificial Valve  . Hyperlipidemia Father   . Atrial fibrillation Mother   . Transient ischemic attack Mother   . Hypertension Mother   . Stroke Mother   . Heart failure Mother     PPM  . Macular degeneration Mother   . Skin cancer Mother   . Heart failure Maternal Grandmother   . Heart  attack Maternal Grandfather   . Clotting disorder Sister   . Healthy Sister   . Healthy Brother    Social History  Substance Use Topics  . Smoking status: Former Research scientist (life sciences)  . Smokeless tobacco: Never Used  . Alcohol use Yes     Comment: rare   OB History    No data available     Review of Systems  Constitutional: Positive for fatigue. Negative for chills and fever.  HENT: Positive for congestion, postnasal drip, sinus pressure and sore throat. Negative for ear pain.   Eyes: Negative for discharge.  Respiratory: Positive for cough. Negative for chest tightness, shortness of breath and wheezing.   Cardiovascular: Negative for chest pain.  Gastrointestinal: Negative for abdominal pain, diarrhea, nausea and vomiting.  Musculoskeletal: Negative for back pain, neck pain and neck stiffness.  Skin: Negative for rash.  Neurological: Positive for headaches. Negative for dizziness, syncope, weakness and light-headedness.  Hematological: Negative for adenopathy.    Allergies  Sulfa antibiotics and Sulfonamide derivatives  Home Medications   Prior to Admission medications   Medication Sig Start Date End Date Taking? Authorizing Provider  acetaminophen (TYLENOL) 650 MG CR tablet Take 1,300 mg by mouth 2 (two) times daily.    Yes Historical Provider, MD  AMBULATORY NON FORMULARY MEDICATION Take 90 mg by mouth 2 (two) times daily. Medication Name: BRILINTA 90 mg  BID (TWILIGHT Research study provided do not fill) 07/25/15  Yes Burnell Blanks, MD  AMBULATORY NON FORMULARY MEDICATION Take 81 mg by mouth daily. Medication Name: ASA 81 mg Daily or PLACEBO (TWILIGHT Research study provided) 10/30/15  Yes Burnell Blanks, MD  Calcium Citrate-Vitamin D (CITRACAL + D PO) Take 1 tablet by mouth 2 (two) times daily.   Yes Historical Provider, MD  Cholecalciferol (VITAMIN D PO) Take 1 tablet by mouth daily.   Yes Historical Provider, MD  citalopram (CELEXA) 20 MG tablet Take 20 mg by mouth  daily.  08/09/12  Yes Historical Provider, MD  metoprolol succinate (TOPROL XL) 25 MG 24 hr tablet Take 0.5 tablets (12.5 mg total) by mouth daily. 12/31/15  Yes Peter M Martinique, MD  Multiple Vitamins-Minerals (MULTIVITAMIN WITH MINERALS) tablet Take 1 tablet by mouth daily. Centrum Silver   Yes Historical Provider, MD  nitroGLYCERIN (NITROSTAT) 0.4 MG SL tablet Place 1 tablet (0.4 mg total) under the tongue every 5 (five) minutes as needed for chest pain. 12/31/15  Yes Peter M Martinique, MD  Turmeric 500 MG CAPS Take 1,000 mg by mouth daily.    Yes Historical Provider, MD  vitamin B-12 (CYANOCOBALAMIN) 1000 MCG tablet Take 1,000 mcg by mouth daily.   Yes Historical Provider, MD  vitamin C (ASCORBIC ACID) 500 MG tablet Take 500 mg by mouth daily.   Yes Historical Provider, MD  amoxicillin-clavulanate (AUGMENTIN) 875-125 MG tablet Take 1 tablet by mouth every 12 (twelve) hours. 08/30/16 09/06/16  Katy Apo, NP  atorvastatin (LIPITOR) 10 MG tablet Take 1 tablet (10 mg total) by mouth daily. 05/22/16 08/21/16  Peter M Martinique, MD   Meds Ordered and Administered this Visit  Medications - No data to display  BP 130/58 (BP Location: Left Arm)   Pulse 78   Temp 98.4 F (36.9 C) (Oral)   Resp 20   SpO2 98%  No data found.   Physical Exam  Constitutional: She is oriented to person, place, and time. She appears well-developed and well-nourished. No distress.  HENT:  Head: Normocephalic and atraumatic.  Right Ear: Hearing, tympanic membrane, external ear and ear canal normal.  Left Ear: Hearing, tympanic membrane, external ear and ear canal normal.  Nose: Mucosal edema present. Right sinus exhibits no maxillary sinus tenderness and no frontal sinus tenderness. Left sinus exhibits no maxillary sinus tenderness and no frontal sinus tenderness.  Mouth/Throat: Uvula is midline and mucous membranes are normal. Posterior oropharyngeal erythema present.  Neck: Normal range of motion. Neck supple.   Cardiovascular: Normal rate, regular rhythm and normal heart sounds.   Pulmonary/Chest: Effort normal. No respiratory distress. She has decreased breath sounds. She has wheezes in the right upper field and the left upper field. She has rhonchi in the right upper field and the left upper field. She has no rales.  Lymphadenopathy:    She has cervical adenopathy.       Right cervical: Deep cervical adenopathy present. No posterior cervical adenopathy present.      Left cervical: Deep cervical adenopathy present. No posterior cervical adenopathy present.  Neurological: She is alert and oriented to person, place, and time.  Skin: Skin is warm and dry. Capillary refill takes less than 2 seconds.  Psychiatric: She has a normal mood and affect. Her behavior is normal. Judgment and thought content normal.    Urgent Care Course     Procedures (including critical care time)  Labs Review Labs Reviewed - No data to display  Imaging Review No results found.   Visual Acuity Review  Right Eye Distance:   Left Eye Distance:   Bilateral Distance:    Right Eye Near:   Left Eye Near:    Bilateral Near:         MDM   1. Acute bronchitis, unspecified organism    Due to other chronic health issues, recommend start Augmentin 875mg  twice a day as directed. May use OTC Delsym at night to help with cough. Declined Tessalon cough pills or RX strength cough medication. Avoid decongestants- may continue nasal saline spray as needed. Follow-up with your primary care provider in 3 days if not improving.      Katy Apo, NP 08/31/16 [phone]

## 2016-09-02 ENCOUNTER — Telehealth: Payer: Self-pay | Admitting: Cardiology

## 2016-09-02 NOTE — Telephone Encounter (Signed)
New message    Pt verbalized that she is calling to inform Dr.jordan that the pain is gone since stopping the medications

## 2016-09-02 NOTE — Telephone Encounter (Signed)
She is intolerant of lipitor due to myalgias. I would like to try her on low dose Crestor 5 mg every other day.   Fontaine Kossman Martinique MD, [facility]

## 2016-09-02 NOTE — Telephone Encounter (Signed)
Spoke w patient. She affirmed this was related to her joint pain and subsequent discontinuation of statin. (See 07/22/16 note).  Seeking further advice on medical therapy, as she now reports resolution of those symptoms.  Allergy and med list updated. Patient aware we will seek review from provider, and communicate recommendations to her.

## 2016-09-03 MED ORDER — ROSUVASTATIN CALCIUM 5 MG PO TABS: ORAL_TABLET | ORAL | 3 refills | Status: DC

## 2016-09-03 NOTE — Telephone Encounter (Signed)
Returned call to patient.Dr.Jordan advised take crestor 5 mg every other day.Advised to call back if she develops joint pain.

## 2016-09-03 NOTE — Telephone Encounter (Signed)
Returned call to patient no answer.LMTC. 

## 2016-10-13 ENCOUNTER — Telehealth: Payer: Self-pay | Admitting: *Deleted

## 2016-10-13 NOTE — Telephone Encounter (Signed)
Informed patient that Dr. Martinique has ordered her to take ASA 81 mg daily after TWILIGHT study. Scheduled last TWILIGHT visit for 10/19/16.

## 2016-10-19 ENCOUNTER — Other Ambulatory Visit: Payer: Self-pay | Admitting: *Deleted

## 2016-10-19 ENCOUNTER — Encounter: Payer: Self-pay | Admitting: *Deleted

## 2016-10-19 DIAGNOSIS — Z006 Encounter for examination for normal comparison and control in clinical research program: Secondary | ICD-10-CM

## 2016-10-19 MED ORDER — ASPIRIN EC 81 MG PO TBEC: 81.0000 mg | DELAYED_RELEASE_TABLET | Freq: Every day | ORAL | 3 refills | Status: AC

## 2016-10-19 NOTE — Progress Notes (Signed)
TWILIGHT research study month 15 visit completed. Patient denies any bleeding events or other adverse events. Patient has been 100% compliant with study provided medication (after pill count). Patient has been instructed to start ASA 81 mg only after TWILIGHT stud treatment. She will start ASA tomorrow. Patient informed for the research study 1 more telephone follow up will be due before 27/JUN/2018. Patient verbalized understanding. I thanked patient for participating in the [place].

## 2016-11-02 ENCOUNTER — Encounter: Payer: Self-pay | Admitting: Cardiology

## 2016-11-03 ENCOUNTER — Other Ambulatory Visit: Payer: Self-pay | Admitting: Family Medicine

## 2016-11-03 ENCOUNTER — Other Ambulatory Visit (HOSPITAL_COMMUNITY)
Admission: RE | Admit: 2016-11-03 | Discharge: 2016-11-03 | Disposition: A | Discharge status: Home / Self Care | Payer: Medicare Other | Source: Ambulatory Visit | Attending: Family Medicine | Admitting: Family Medicine

## 2016-11-03 DIAGNOSIS — Z124 Encounter for screening for malignant neoplasm of cervix: Secondary | ICD-10-CM | POA: Diagnosis present

## 2016-11-05 LAB — CYTOLOGY - PAP: DIAGNOSIS: NEGATIVE

## 2016-11-13 NOTE — Progress Notes (Signed)
CARDIOLOGY OFFICE NOTE  Date:  11/16/2016    [name] Date of Birth: 1951/03/31 Medical Record #[id number]  PCP:  Reginia Naas, MD  Cardiologist:  Wood Novacek [place]  MD  Chief Complaint  Patient presents with  . Coronary Artery Disease    History of Present Illness: Kaitlyn Lozano is a 66 y.o. female who is seen for follow up CAD.   She presented to [facility] ED on 07/21/2015 for new-onset chest pain. LHC on 07/23/2015 showed 95% stenosis of the Mid-LAD. PCI performed with DES. She had residual 55-60% disease in OM1 and OM2.   On follow up today she is doing well.  She is following a Zone diet.  Feels very well. No chest pain or SOB. Walks 1.5 miles/day.  She is off Brilinta now and taking ASA 81 mg daily. She was intolerant of atorvastatin but is tolerating a low dose of Crestor well. She does note some difficulty remembering words at times.  Past Medical History:  Diagnosis Date  . Arthritis    OA  . Bronchitis    eosinophilic  . CAD (coronary artery disease) 07/21/2015   a. 07/2015: 95% stenosis of LAD -->2.5 mm x 16 mm Synergy DES placed, 60% stenosis 1st Mrg, 55% stenosis 2nd Mrg, EF normal  . Depression   . Depression   . GERD (gastroesophageal reflux disease)     Past Surgical History:  Procedure Laterality Date  . CARDIAC CATHETERIZATION N/A 07/23/2015   Procedure: Left Heart Cath and Coronary Angiography;  Surgeon: Leonie Man, MD;  Location: [place] [facility];  Service: Cardiovascular;  Laterality: N/A;  . CARDIAC CATHETERIZATION N/A 07/23/2015   Procedure: Coronary Stent Intervention;  Surgeon: Leonie Man, MD;  Location: [place] [facility];  Service: Cardiovascular;  Laterality: N/A;  . DILATION AND CURETTAGE OF UTERUS  [phone]  . dnc    . HERNIA REPAIR       Medications: Current Outpatient Prescriptions  Medication Sig Dispense Refill  . acetaminophen (TYLENOL) 650 MG CR tablet Take 1,300 mg by mouth 2 (two) times daily.     Marland Kitchen  aspirin EC 81 MG tablet Take 1 tablet (81 mg total) by mouth daily. 90 tablet 3  . Calcium Citrate-Vitamin D (CITRACAL + D PO) Take 1 tablet by mouth 2 (two) times daily.    . Cholecalciferol (VITAMIN D PO) Take 1 tablet by mouth daily.    . citalopram (CELEXA) 20 MG tablet Take 20 mg by mouth daily.     . metoprolol succinate (TOPROL XL) 25 MG 24 hr tablet Take 0.5 tablets (12.5 mg total) by mouth daily. 90 tablet 1  . Multiple Vitamins-Minerals (MULTIVITAMIN WITH MINERALS) tablet Take 1 tablet by mouth daily. Centrum Silver    . nitroGLYCERIN (NITROSTAT) 0.4 MG SL tablet Place 1 tablet (0.4 mg total) under the tongue every 5 (five) minutes as needed for chest pain. 20 tablet 5  . rosuvastatin (CRESTOR) 5 MG tablet Take 5 mg every other day 45 tablet 3  . Turmeric 500 MG CAPS Take 1,000 mg by mouth daily.     . vitamin B-12 (CYANOCOBALAMIN) 1000 MCG tablet Take 1,000 mcg by mouth daily.    . vitamin C (ASCORBIC ACID) 500 MG tablet Take 500 mg by mouth daily.     No current facility-administered medications for this visit.     Allergies: Allergies  Allergen Reactions  . Sulfa Antibiotics Rash  . Atorvastatin Other (See Comments)  Joint pain  . Sulfonamide Derivatives Rash    Social History: The patient  reports that she has quit smoking. She has never used smokeless tobacco. She reports that she drinks alcohol. She reports that she does not use drugs.   Family History: The patient's family history includes Aortic stenosis in her father; Atrial fibrillation in her mother; Clotting disorder in her sister; Healthy in her brother and sister; Heart attack in her maternal grandfather; Heart failure in her maternal grandmother and mother; Hyperlipidemia in her father; Hypertension in her mother; Macular degeneration in her mother; Skin cancer in her mother; Stroke in her mother; Transient ischemic attack in her mother.   Review of Systems: Please see the history of present illness.    Otherwise, the review of systems is positive for none.   All other systems are reviewed and negative.   Physical Exam: VS:  BP 118/62   Pulse 67   Ht 5\' 4"  (1.626 m)   Wt 118 lb (53.5 kg)   BMI 20.25 kg/m  .  BMI Body mass index is 20.25 kg/m.  Wt Readings from Last 3 Encounters:  11/16/16 118 lb (53.5 kg)  05/22/16 124 lb 12.8 oz (56.6 kg)  11/12/15 139 lb (63 kg)    General: Pleasant. Well developed, thin and in no acute distress.   HEENT: Normal.  Neck: Supple, no JVD, carotid bruits, or masses noted.  Cardiac: Regular rate and rhythm. No murmurs, rubs, or gallops. No edema.  Respiratory:  Lungs are clear to auscultation bilaterally with normal work of breathing.  GI: Soft and nontender.  MS: No deformity or atrophy. Gait and ROM intact.  Skin: Warm and dry. Color is normal.  Neuro:  Strength and sensation are intact and no gross focal deficits noted.  Psych: Alert, appropriate and with normal affect.     LABORATORY DATA:  EKG:  EKG  ordered today. NSR  Rate 67. Normal Ecg. I have personally reviewed and interpreted this study.   Lab Results  Component Value Date   WBC 8.0 08/26/2015   HGB 13.9 08/26/2015   HCT 39.1 08/26/2015   PLT 350 08/26/2015   GLUCOSE 80 07/22/2016   CHOL 104 07/22/2016   TRIG 47 07/22/2016   HDL 59 07/22/2016   LDLCALC 36 07/22/2016   ALT 8 07/22/2016   AST 17 07/22/2016   NA 141 07/22/2016   K 5.2 07/22/2016   CL 107 07/22/2016   CREATININE 0.74 07/22/2016   BUN 21 07/22/2016   CO2 26 07/22/2016   INR 1.19 07/23/2015    BNP (last 3 results) No results for input(s): BNP in the last 8760 hours.  ProBNP (last 3 results) No results for input(s): PROBNP in the last 8760 hours.   Other Studies Reviewed Today:  Labs from 11/03/16: Cholesterol 132, trig 89, HDL 62, LDL 53. CMET and CBC normal.   Assessment/Plan: 1. CAD s/p NSTEMI Dec 2016 - s/p PCI to the mid LAD with DES - she is asymptomatic. Continue ASA, metoprolol, low dose  statin.  2.   HLD - on statin therapy with excellent response.    Current medicines are reviewed with the patient today.  The patient does not have concerns regarding medicines other than what has been noted above.  The following changes have been made:  See above.  Labs/ tests ordered today include:    No orders of the defined types were placed in this encounter.    Disposition:   FU with me in  6 months  Patient is agreeable to this plan and will call if any problems develop in the interim.   Signed: Marialena Wollen [place] MD, [facility]    11/16/2016 9:56 AM  [place]

## 2016-11-16 ENCOUNTER — Ambulatory Visit (INDEPENDENT_AMBULATORY_CARE_PROVIDER_SITE_OTHER): Payer: Medicare Other | Admitting: Cardiology

## 2016-11-16 ENCOUNTER — Encounter: Payer: Self-pay | Admitting: Cardiology

## 2016-11-16 VITALS — BP 118/62 | HR 67 | Ht 64.0 in | Wt 118.0 lb

## 2016-11-16 DIAGNOSIS — I2583 Coronary atherosclerosis due to lipid rich plaque: Secondary | ICD-10-CM | POA: Diagnosis not present

## 2016-11-16 DIAGNOSIS — E785 Hyperlipidemia, unspecified: Secondary | ICD-10-CM

## 2016-11-16 DIAGNOSIS — I2510 Atherosclerotic heart disease of native coronary artery without angina pectoris: Secondary | ICD-10-CM | POA: Diagnosis not present

## 2016-11-16 NOTE — Addendum Note (Signed)
Addended by: Kathyrn Lass on: 11/16/2016 10:01 AM   Modules accepted: Orders

## 2016-11-16 NOTE — Patient Instructions (Signed)
Continue your current therapy  I will see you in 6 months.   

## 2016-11-19 ENCOUNTER — Other Ambulatory Visit: Payer: Self-pay | Admitting: Cardiology

## 2016-11-19 NOTE — Telephone Encounter (Signed)
REFILL 

## 2017-01-01 ENCOUNTER — Other Ambulatory Visit: Payer: Self-pay | Admitting: Cardiology

## 2017-01-07 ENCOUNTER — Encounter: Payer: Self-pay | Admitting: *Deleted

## 2017-01-07 DIAGNOSIS — Z006 Encounter for examination for normal comparison and control in clinical research program: Secondary | ICD-10-CM

## 2017-01-07 NOTE — Progress Notes (Signed)
TWILIGHT Research study month 18 telephone follow up completed 01/06/17. Patient denies any bleeding events. She continues on ASA 81 mg daily. She was very appreciative of the free medications form the study and Thanked [place] for enrolling her.

## 2017-01-08 ENCOUNTER — Telehealth: Payer: Self-pay | Admitting: Cardiology

## 2017-01-08 NOTE — Telephone Encounter (Signed)
Notified patient that we cannot obtain another stent card. Patient requested I provide her with details of her stent for her records. Notified her of stent type, size, location, interventional cardiologist.

## 2017-01-08 NOTE — Telephone Encounter (Signed)
Pt lost wallet that had her insurance card,Dr. Doug Sou card,and her card that had the info on what kind of stent she had put in tha she is to carry with her, she wants to know if we can replace that card for her? Pls advise

## 2017-01-08 NOTE — Telephone Encounter (Signed)
Pt notified cheryl/jordan out of the ofice will be back monday

## 2017-01-08 NOTE — Telephone Encounter (Signed)
I don't think we can replace her stent card but given the EMR age I don't think she'll need it since this is well documented in her record.  Peter Martinique MD, [facility]

## 2017-01-28 ENCOUNTER — Telehealth: Payer: Self-pay | Admitting: Cardiology

## 2017-01-28 NOTE — Telephone Encounter (Signed)
New message     Pt c/o medication issue:  1. Name of Medication: Crestor  2. How are you currently taking this medication (dosage and times per day)?  Once every other day  3. Are you having a reaction (difficulty breathing--STAT)?  no  4. What is your medication issue?  Having tingling in both legs , standing or sitting ,

## 2017-01-28 NOTE — Telephone Encounter (Signed)
Spoke w patient. Cites hx of intolerance to statins (atorvastatin - w/ production of lower extremity muscle aches)  States 1 week ago she had sudden onset of leg tingling bilaterally. Describes as similar to sensation of legs falling asleep.   This sensation is continuous.  She voices that she is not having any muscle weakness, pain, trouble standing/ambulating/etc.   She is currently on crestor 5mg  every other day - wants to know if this might be cause of new symptom.  Pt aware we will review concerns and return her call w advice.

## 2017-01-28 NOTE — Telephone Encounter (Signed)
Crestor (rosuvastatin) may cause current symptoms but may attributed to other causes as well.   Recommendation.  1. STOP crestor for 1 week and re-assess for symptoms resolution.  2. Resume crestor ( after 1 week ) at lower dose of 1 tablet every week  3. Increase dose to 1 tablet every Monday and Friday if able to tolerate once weekly dose for 3 weeks.

## 2017-01-28 NOTE — Telephone Encounter (Signed)
Spoke w patient regarding recommendations. She'll stop med and agreed to plan as outlined. She'll call back in 1 week to apprise [place] of symptoms. If resolved will plan to go to once weekly dosing x3 weeks.

## 2017-02-15 ENCOUNTER — Telehealth: Payer: Self-pay | Admitting: Cardiology

## 2017-02-15 NOTE — Telephone Encounter (Signed)
New message    Pt is calling asking for a call back. She said it is about her Crestor.

## 2017-02-15 NOTE — Telephone Encounter (Signed)
Returned call to patient. She notes crestor was held ~3 weeks ago due to tingling sensation in legs. (see prior telephone note)   She states the tingling actually got worse while holding the med, but then got better in last few days. Patient notes tingling went away today.  She has a history of lumbar disc disease and thinks this might be the culprit. She'll pursue workup on this by specialist. She needs instructions on resuming crestor, if advised.

## 2017-02-15 NOTE — Telephone Encounter (Signed)
She was previously on rosuvastatin 5 mg every other day, with excellent LDL.  Would advise that she go back to this dose and monitor for any worsening of tingling sensation.  If f/u regarding lumbar disc disease shows that is not cause of tingling, would re-evaluate use of rosuvastatin.

## 2017-02-15 NOTE — Telephone Encounter (Signed)
Patient advised and voiced understanding of recommendations.

## 2017-04-28 ENCOUNTER — Other Ambulatory Visit: Payer: Self-pay | Admitting: Cardiology

## 2017-04-28 NOTE — Telephone Encounter (Signed)
REFILL 

## 2017-05-04 ENCOUNTER — Encounter: Payer: Self-pay | Admitting: *Deleted

## 2017-05-04 ENCOUNTER — Emergency Department
Admission: EM | Admit: 2017-05-04 | Discharge: 2017-05-04 | Disposition: A | Discharge status: Home / Self Care | Payer: Medicare Other | Source: Home / Self Care | Attending: Family Medicine | Admitting: Family Medicine

## 2017-05-04 ENCOUNTER — Emergency Department (INDEPENDENT_AMBULATORY_CARE_PROVIDER_SITE_OTHER): Payer: Medicare Other

## 2017-05-04 DIAGNOSIS — M19042 Primary osteoarthritis, left hand: Secondary | ICD-10-CM

## 2017-05-04 DIAGNOSIS — S63617A Unspecified sprain of left little finger, initial encounter: Secondary | ICD-10-CM

## 2017-05-04 NOTE — ED Triage Notes (Signed)
Pt c/o LT 5th finger injury 1 mth ago with continued welling.

## 2017-05-04 NOTE — ED Provider Notes (Signed)
[facility]    CSN: [id number] Arrival date & time: 05/04/17  1725     History   Chief Complaint Chief Complaint  Patient presents with  . Finger Injury    HPI Kaitlyn Lozano is a 66 y.o. female.   HPI  Kaitlyn Lozano is a 66 y.o. female presenting to UC with c/o Left little finger pain and swelling that started about 1 month ago after trip and fall, injuring her Left little finger.  She has been taking Tylenol with mild temporary relief.  No decreased ROM but she has noticed swelling to her joint and continued soreness. No other injuries. She is Right hand dominant.     Past Medical History:  Diagnosis Date  . Arthritis    OA  . Bronchitis    eosinophilic  . CAD (coronary artery disease) 07/21/2015   a. 07/2015: 95% stenosis of LAD -->2.5 mm x 16 mm Synergy DES placed, 60% stenosis 1st Mrg, 55% stenosis 2nd Mrg, EF normal  . Depression   . Depression   . GERD (gastroesophageal reflux disease)     Patient Active Problem List   Diagnosis Date Noted  . Dyslipidemia 11/12/2015  . Coronary artery disease due to lipid rich plaque 10/29/2015  . NSTEMI (non-ST elevated myocardial infarction) ([place]) 07/24/2015  . Abnormal finding on EKG 07/23/2015  . Atherosclerosis of native coronary artery of native heart with unstable angina pectoris ([place])   . Depression 07/22/2015  . Shortness of breath 07/22/2015  . Family history of premature coronary artery disease 07/22/2015  . Acute coronary syndrome ([place])   . Chest pain 07/21/2015  . URI 12/13/2009  . ACUTE CYSTITIS 07/02/2009  . ACUTE MAXILLARY SINUSITIS 07/17/2008  . DYSPHONIA 11/08/2007  . COUGH 11/08/2007    Past Surgical History:  Procedure Laterality Date  . CARDIAC CATHETERIZATION N/A 07/23/2015   Procedure: Left Heart Cath and Coronary Angiography;  Surgeon: Leonie Man, MD;  Location: [place] [facility];  Service: Cardiovascular;  Laterality: N/A;  . CARDIAC CATHETERIZATION N/A 07/23/2015   Procedure: Coronary Stent Intervention;  Surgeon: Leonie Man, MD;  Location: [place] [facility];  Service: Cardiovascular;  Laterality: N/A;  . DILATION AND CURETTAGE OF UTERUS  [phone]  . dnc    . HERNIA REPAIR      OB History    No data available       Home Medications    Prior to Admission medications   Medication Sig Start Date End Date Taking? Authorizing Provider  acetaminophen (TYLENOL) 650 MG CR tablet Take 1,300 mg by mouth 2 (two) times daily.     [provider]  aspirin EC 81 MG tablet Take 1 tablet (81 mg total) by mouth daily. 10/19/16   Burnell Blanks, MD  Calcium Citrate-Vitamin D (CITRACAL + D PO) Take 1 tablet by mouth 2 (two) times daily.    [provider]  Cholecalciferol (VITAMIN D PO) Take 1 tablet by mouth daily.    [provider]  citalopram (CELEXA) 20 MG tablet Take 20 mg by mouth daily.  08/09/12   [provider]  metoprolol succinate (TOPROL-XL) 25 MG 24 hr tablet TAKE ONE-HALF TABLET BY  MOUTH DAILY 04/28/17   Martinique, Peter M, MD  Multiple Vitamins-Minerals (MULTIVITAMIN WITH MINERALS) tablet Take 1 tablet by mouth daily. Centrum Silver    [provider]  nitroGLYCERIN (NITROSTAT) 0.4 MG SL tablet DISSOLVE 1 TABLET UNDER  TONGUE EVERY 5 MINUTES AS  NEEDED  FOR CHEST PAIN  (MAXIMUM 3 TABLETS IN 15  MINUTES) 01/01/17   Martinique, Peter M, MD  rosuvastatin (CRESTOR) 5 MG tablet Take 5 mg every other day 09/03/16   [place], Peter M, MD  Turmeric 500 MG CAPS Take 1,000 mg by mouth daily.     [provider]  vitamin B-12 (CYANOCOBALAMIN) 1000 MCG tablet Take 1,000 mcg by mouth daily.    [provider]  vitamin C (ASCORBIC ACID) 500 MG tablet Take 500 mg by mouth daily.    [provider]    Family History Family History  Problem Relation Age of Onset  . Aortic stenosis Father        Artificial Valve  . Hyperlipidemia Father   . Atrial fibrillation Mother   . Transient ischemic  attack Mother   . Hypertension Mother   . Stroke Mother   . Heart failure Mother        PPM  . Macular degeneration Mother   . Skin cancer Mother   . Heart failure Maternal Grandmother   . Heart attack Maternal Grandfather   . Clotting disorder Sister   . Healthy Sister   . Healthy Brother     Social History Social History  Substance Use Topics  . Smoking status: Former Research scientist (life sciences)  . Smokeless tobacco: Never Used  . Alcohol use Yes     Comment: rare     Allergies   Sulfa antibiotics; Atorvastatin; and Sulfonamide derivatives   Review of Systems Review of Systems  Musculoskeletal: Positive for arthralgias and joint swelling.  Skin: Negative for color change and wound.  Neurological: Negative for weakness and numbness.     Physical Exam Triage Vital Signs ED Triage Vitals  Enc Vitals Group     BP 05/04/17 1752 102/63     Pulse Rate 05/04/17 1752 68     Resp 05/04/17 1752 16     Temp 05/04/17 1752 98.4 F (36.9 C)     Temp Source 05/04/17 1752 Oral     SpO2 05/04/17 1752 100 %     Weight 05/04/17 1753 117 lb (53.1 kg)     Height 05/04/17 1753 5\' 4"  (1.626 m)     Head Circumference --      Peak Flow --      Pain Score 05/04/17 1753 7     Pain Loc --      Pain Edu? --      Excl. in [place]? --    No data found.   Updated Vital Signs BP 102/63 (BP Location: Left Arm)   Pulse 68   Temp 98.4 F (36.9 C) (Oral)   Resp 16   Ht 5\' 4"  (1.626 m)   Wt 117 lb (53.1 kg)   SpO2 100%   BMI 20.08 kg/m   Visual Acuity Right Eye Distance:   Left Eye Distance:   Bilateral Distance:    Right Eye Near:   Left Eye Near:    Bilateral Near:     Physical Exam  Constitutional: She is oriented to person, place, and time. She appears well-developed and well-nourished.  HENT:  Head: Normocephalic and atraumatic.  Eyes: EOM are normal.  Neck: Normal range of motion.  Cardiovascular: Normal rate.   Pulmonary/Chest: Effort normal.  Musculoskeletal: Normal range of motion.  She exhibits edema and tenderness.  Left little finger: mild edema and tenderness to PIP joint. Full ROM  Neurological: She is alert and oriented to person, place, and time.  Skin: Skin  is warm and dry. Capillary refill takes less than 2 seconds.  Left little finger: skin in tact. No ecchymosis or erythema.   Psychiatric: She has a normal mood and affect. Her behavior is normal.  Nursing note and vitals reviewed.    UC Treatments / Results  Labs (all labs ordered are listed, but only abnormal results are displayed) Labs Reviewed - No data to display  EKG  EKG Interpretation None       Radiology Dg Finger Little Left  Result Date: 05/04/2017 CLINICAL DATA:  Initial evaluation for pain and swelling for 1 month. EXAM: LEFT LITTLE FINGER 2+V COMPARISON:  None. FINDINGS: No acute fracture or dislocation. No acute soft tissue abnormality. Osteopenia. Degenerative osteoarthrosis at the left fifth DIP joint. IMPRESSION: 1. No acute osseous abnormality. 2. Degenerative osteoarthrosis at the left fifth PIP joint. Electronically Signed   By: Jeannine Boga M.D.   On: 05/04/2017 18:18    Procedures Procedures (including critical care time)  Medications Ordered in UC Medications - No data to display   Initial Impression / Assessment and Plan / UC Course  I have reviewed the triage vital signs and the nursing notes.  Pertinent labs & imaging results that were available during my care of the patient were reviewed by me and considered in my medical decision making (see chart for details).     Left little finger- no fracture or dislocation, however there is OA in PIP joint (where swelling and soreness noted on exam) Will treat as mild sprain with finger splint and fingers strapped using buddy taping technique.  F/u with PCP, orthopedist, or Sports Medicine in 1-2 weeks if not improving.   Final Clinical Impressions(s) / UC Diagnoses   Final diagnoses:  Sprain of left little  finger, initial encounter    New Prescriptions Discharge Medication List as of 05/04/2017  6:27 PM       Controlled Substance Prescriptions [place] Controlled Substance Registry consulted? Not Applicable   Tyrell Antonio 05/04/17 1927

## 2017-05-23 NOTE — Progress Notes (Signed)
CARDIOLOGY OFFICE NOTE  Date:  05/28/2017    [name] Date of Birth: 09-06-1950 Medical Record #[id number]  PCP:  Carol Ada, MD  Cardiologist:  Karyss Frese Martinique  MD  Chief Complaint  Patient presents with  . Follow-up  . Coronary Artery Disease    History of Present Illness: Kaitlyn Lozano is a 66 y.o. female who is seen for follow up CAD.   She was admitted on 07/21/2015 for new-onset chest pain. LHC on 07/23/2015 showed 95% stenosis of the Mid-LAD. PCI performed with DES. She had residual 55-60% disease in OM1 and OM2.   On follow up today she is doing well.  She is following a Zone diet.  She does note she doesn't feel as confident about her health and is worried about what is going to happen to her arteries.  No chest pain or SOB. Walks 30 minutes/day or rides a stationary bike.   She was intolerant of atorvastatin but is tolerating a low dose of Crestor well. She has some mild palpitations. She did have some tingling of her legs that has resolved.  Past Medical History:  Diagnosis Date  . Arthritis    OA  . Bronchitis    eosinophilic  . CAD (coronary artery disease) 07/21/2015   a. 07/2015: 95% stenosis of LAD -->2.5 mm x 16 mm Synergy DES placed, 60% stenosis 1st Mrg, 55% stenosis 2nd Mrg, EF normal  . Depression   . Depression   . GERD (gastroesophageal reflux disease)     Past Surgical History:  Procedure Laterality Date  . CARDIAC CATHETERIZATION N/A 07/23/2015   Procedure: Left Heart Cath and Coronary Angiography;  Surgeon: Leonie Man, MD;  Location: [place] [facility];  Service: Cardiovascular;  Laterality: N/A;  . CARDIAC CATHETERIZATION N/A 07/23/2015   Procedure: Coronary Stent Intervention;  Surgeon: Leonie Man, MD;  Location: [place] [facility];  Service: Cardiovascular;  Laterality: N/A;  . DILATION AND CURETTAGE OF UTERUS  [phone]  . dnc    . HERNIA REPAIR       Medications: Current Outpatient Prescriptions  Medication Sig  Dispense Refill  . acetaminophen (TYLENOL) 650 MG CR tablet Take 1,300 mg by mouth 2 (two) times daily.     Marland Kitchen aspirin EC 81 MG tablet Take 1 tablet (81 mg total) by mouth daily. 90 tablet 3  . Calcium Citrate-Vitamin D (CITRACAL + D PO) Take 1 tablet by mouth 2 (two) times daily.    . Cholecalciferol (VITAMIN D PO) Take 1 tablet by mouth daily.    Marland Kitchen FLUoxetine (PROZAC) 20 MG capsule TAKE 1 CAPSULE BY MOUTH EVERY DAY IN THE MORNING  1  . metoprolol succinate (TOPROL-XL) 25 MG 24 hr tablet TAKE ONE-HALF TABLET BY  MOUTH DAILY 45 tablet 1  . Multiple Vitamins-Minerals (MULTIVITAMIN WITH MINERALS) tablet Take 1 tablet by mouth daily. Centrum Silver    . nitroGLYCERIN (NITROSTAT) 0.4 MG SL tablet DISSOLVE 1 TABLET UNDER  TONGUE EVERY 5 MINUTES AS  NEEDED FOR CHEST PAIN  (MAXIMUM 3 TABLETS IN 15  MINUTES) 75 tablet 1  . rosuvastatin (CRESTOR) 5 MG tablet Take 5 mg every other day 45 tablet 3  . Turmeric 500 MG CAPS Take 1,000 mg by mouth daily.     . vitamin B-12 (CYANOCOBALAMIN) 1000 MCG tablet Take 1,000 mcg by mouth daily.    . vitamin C (ASCORBIC ACID) 500 MG tablet Take 500 mg by mouth daily.     No  current facility-administered medications for this visit.     Allergies: Allergies  Allergen Reactions  . Sulfa Antibiotics Rash  . Atorvastatin Other (See Comments)    Joint pain  . Sulfonamide Derivatives Rash    Social History: The patient  reports that she has quit smoking. She has never used smokeless tobacco. She reports that she drinks alcohol. She reports that she does not use drugs.   Family History: The patient's family history includes Aortic stenosis in her father; Atrial fibrillation in her mother; Clotting disorder in her sister; Healthy in her brother and sister; Heart attack in her maternal grandfather; Heart failure in her maternal grandmother and mother; Hyperlipidemia in her father; Hypertension in her mother; Macular degeneration in her mother; Skin cancer in her mother;  Stroke in her mother; Transient ischemic attack in her mother.   Review of Systems: Please see the history of present illness.   Otherwise, the review of systems is positive for none.   All other systems are reviewed and negative.   Physical Exam: VS:  BP (!) 113/53   Pulse 65   Ht 5\' 4"  (1.626 m)   Wt 115 lb (52.2 kg)   BMI 19.74 kg/m  .  BMI Body mass index is 19.74 kg/m.  Wt Readings from Last 3 Encounters:  05/28/17 115 lb (52.2 kg)  05/04/17 117 lb (53.1 kg)  11/16/16 118 lb (53.5 kg)   GENERAL:  Well appearing, thin WF in NAD HEENT:  PERRL, EOMI, sclera are clear. Oropharynx is clear. NECK:  No jugular venous distention, carotid upstroke brisk and symmetric, no bruits, no thyromegaly or adenopathy LUNGS:  Clear to auscultation bilaterally CHEST:  Unremarkable HEART:  RRR,  PMI not displaced or sustained,S1 and S2 within normal limits, no S3, no S4: no clicks, no rubs, no murmurs ABD:  Soft, nontender. BS +, no masses or bruits. No hepatomegaly, no splenomegaly EXT:  2 + pulses throughout, no edema, no cyanosis no clubbing SKIN:  Warm and dry.  No rashes NEURO:  Alert and oriented x 3. Cranial nerves II through XII intact. PSYCH:  Cognitively intact      LABORATORY DATA:   Lab Results  Component Value Date   WBC 8.0 08/26/2015   HGB 13.9 08/26/2015   HCT 39.1 08/26/2015   PLT 350 08/26/2015   GLUCOSE 80 07/22/2016   CHOL 104 07/22/2016   TRIG 47 07/22/2016   HDL 59 07/22/2016   LDLCALC 36 07/22/2016   ALT 8 07/22/2016   AST 17 07/22/2016   NA 141 07/22/2016   K 5.2 07/22/2016   CL 107 07/22/2016   CREATININE 0.74 07/22/2016   BUN 21 07/22/2016   CO2 26 07/22/2016   INR 1.19 07/23/2015    BNP (last 3 results) No results for input(s): BNP in the last 8760 hours.  ProBNP (last 3 results) No results for input(s): PROBNP in the last 8760 hours.   Other Studies Reviewed Today:  Labs from 11/03/16: Cholesterol 132, trig 89, HDL 62, LDL 53. CMET and  CBC normal.   Assessment/Plan: 1. CAD s/p NSTEMI Dec 2016 - s/p PCI to the mid LAD with DES - she is asymptomatic but worried. She did have modest disease in OMs. Continue ASA, metoprolol, low dose statin. I have recommended a stress Echo to assess residual risk.   2.   HLD - on statin therapy with excellent response.    Current medicines are reviewed with the patient today.  The patient does not have  concerns regarding medicines other than what has been noted above.  The following changes have been made:  See above.  Labs/ tests ordered today include:    No orders of the defined types were placed in this encounter.    Disposition:   FU with me in 6 months  Patient is agreeable to this plan and will call if any problems develop in the interim.   Signed: Sueo Cullen Martinique MD, [facility]    05/28/2017 12:09 PM  [place]

## 2017-05-28 ENCOUNTER — Ambulatory Visit (INDEPENDENT_AMBULATORY_CARE_PROVIDER_SITE_OTHER): Payer: Medicare Other | Admitting: Cardiology

## 2017-05-28 ENCOUNTER — Encounter: Payer: Self-pay | Admitting: Cardiology

## 2017-05-28 VITALS — BP 113/53 | HR 65 | Ht 64.0 in | Wt 115.0 lb

## 2017-05-28 DIAGNOSIS — E785 Hyperlipidemia, unspecified: Secondary | ICD-10-CM | POA: Diagnosis not present

## 2017-05-28 DIAGNOSIS — I2510 Atherosclerotic heart disease of native coronary artery without angina pectoris: Secondary | ICD-10-CM | POA: Diagnosis not present

## 2017-05-28 DIAGNOSIS — I25110 Atherosclerotic heart disease of native coronary artery with unstable angina pectoris: Secondary | ICD-10-CM | POA: Diagnosis not present

## 2017-05-28 NOTE — Patient Instructions (Addendum)
We will schedule you for a stress Echo.  Continue your current therapy  I will see you in 6 months

## 2017-06-02 ENCOUNTER — Other Ambulatory Visit: Payer: Self-pay | Admitting: Cardiology

## 2017-06-22 ENCOUNTER — Telehealth (HOSPITAL_COMMUNITY): Payer: Self-pay | Admitting: *Deleted

## 2017-06-22 NOTE — Telephone Encounter (Signed)
Left message on voicemail per DPR in reference to upcoming appointment scheduled on 06/20/17 at 2:30 with detailed instructions given per Stress Test Requisition Sheet for the test. LM to arrive 30 minutes early, and that it is imperative to arrive on time for appointment to keep from having the test rescheduled. If you need to cancel or reschedule your appointment, please call the office within 24 hours of your appointment. Failure to do so may result in a cancellation of your appointment, and a $50 no show fee. Phone number given for call back for any questions. Veronia Beets

## 2017-06-30 ENCOUNTER — Ambulatory Visit (HOSPITAL_COMMUNITY): Payer: Medicare Other | Attending: Cardiology

## 2017-06-30 ENCOUNTER — Ambulatory Visit (HOSPITAL_COMMUNITY): Payer: Medicare Other

## 2017-06-30 DIAGNOSIS — E785 Hyperlipidemia, unspecified: Secondary | ICD-10-CM | POA: Diagnosis not present

## 2017-06-30 DIAGNOSIS — I25110 Atherosclerotic heart disease of native coronary artery with unstable angina pectoris: Secondary | ICD-10-CM | POA: Diagnosis not present

## 2017-06-30 DIAGNOSIS — R002 Palpitations: Secondary | ICD-10-CM | POA: Insufficient documentation

## 2017-06-30 DIAGNOSIS — I2510 Atherosclerotic heart disease of native coronary artery without angina pectoris: Secondary | ICD-10-CM | POA: Diagnosis not present

## 2017-06-30 DIAGNOSIS — I252 Old myocardial infarction: Secondary | ICD-10-CM | POA: Insufficient documentation

## 2017-06-30 LAB — ECHOCARDIOGRAM STRESS TEST
CHL CUP TV REG PEAK VELOCITY: 213 cm/s
FS: 33 % (ref 28–44)
IV/PV OW: 1.22
LA ID, A-P, ES: 30 mm
LA diam index: 1.94 cm/m2
LEFT ATRIUM END SYS DIAM: 30 mm
LV PW d: 9 mm — AB (ref 0.6–1.1)
TRMAXVEL: 213 cm/s

## 2017-07-02 ENCOUNTER — Telehealth: Payer: Self-pay | Admitting: Cardiology

## 2017-07-02 NOTE — Telephone Encounter (Signed)
Returned call - pt received msg from [place] regarding stress echo results.  Results and recommendations discussed with patient, who verbalized understanding and thanks.

## 2017-07-02 NOTE — Telephone Encounter (Signed)
New message  Pt verbalized that she is returning call for the [facility]

## 2017-09-02 ENCOUNTER — Telehealth: Payer: Self-pay | Admitting: Cardiology

## 2017-09-02 NOTE — Telephone Encounter (Signed)
Pt c/o of Chest Pain: STAT if CP now or developed within 24 hours  1. Are you having CP right now? Yes "somewhat"  2. Are you experiencing any other symptoms (ex. SOB, nausea, vomiting, sweating)? Patient has been sick for a few days  3. How long have you been experiencing CP? Intensity started today   4. Is your CP continuous or coming and going? Coming and going  5. Have you taken Nitroglycerin? two ?

## 2017-09-02 NOTE — Telephone Encounter (Signed)
Spoke with patient and she started last week with bad headache for days. On Saturday she started with fever 100-101 until yesterday. Yesterday morning she had nausea and felt like she was going to pass out which passed quickly after sitting down. She has been drinking plenty of fluids. Patient denied any cough, sore throat, or other things associated with fever. Today she woke up with intense pain across her shoulder blades all day. Around 4:00 pm she took NTG and after 2 tablets pain eased up.  Patient was concerned could be cardiac related. Patient denies any shortness of breath, chest pain, or radiating pain. Nothing like when she had her symptoms before STENT. Discussed with Dr Claiborne Billings DOD and he thought less likely cardiac but if pain returns go to ED. Advised patient, verbalized understanding.

## 2017-10-07 ENCOUNTER — Other Ambulatory Visit: Payer: Self-pay | Admitting: Cardiology

## 2017-10-07 NOTE — Telephone Encounter (Signed)
REFILL 

## 2017-11-14 NOTE — Progress Notes (Signed)
CARDIOLOGY OFFICE NOTE  Date:  11/16/2017    Kaitlyn Lozano Date of Birth: 1950/09/21 Medical Record #[id number]  PCP:  Carol Ada, MD  Cardiologist:  Kaoir Loree Martinique  MD  Chief Complaint  Patient presents with  . Coronary Artery Disease    History of Present Illness: Kaitlyn Lozano is a 67 y.o. female who is seen for follow up CAD.   She was admitted on 07/21/2015 for new-onset chest pain. LHC on 07/23/2015 showed 95% stenosis of the Mid-LAD. PCI performed with DES. She had residual 55-60% disease in OM1 and OM2.   Due to patient concern she underwent a stress Echo in November 2018 which was normal.   On follow up today she is doing well.  She is still eating healthy. Walks 30 minutes a day. Will swim during the summer. Was sick back in February. Labs showed elevated potassium 5.7. She had repeat lab work last week. Results not yet available.   Past Medical History:  Diagnosis Date  . Arthritis    OA  . Bronchitis    eosinophilic  . CAD (coronary artery disease) 07/21/2015   a. 07/2015: 95% stenosis of LAD -->2.5 mm x 16 mm Synergy DES placed, 60% stenosis 1st Mrg, 55% stenosis 2nd Mrg, EF normal  . Depression   . Depression   . GERD (gastroesophageal reflux disease)     Past Surgical History:  Procedure Laterality Date  . CARDIAC CATHETERIZATION N/A 07/23/2015   Procedure: Left Heart Cath and Coronary Angiography;  Surgeon: Leonie Man, MD;  Location: [place] [facility];  Service: Cardiovascular;  Laterality: N/A;  . CARDIAC CATHETERIZATION N/A 07/23/2015   Procedure: Coronary Stent Intervention;  Surgeon: Leonie Man, MD;  Location: [place] [facility];  Service: Cardiovascular;  Laterality: N/A;  . DILATION AND CURETTAGE OF UTERUS  [phone]  . dnc    . HERNIA REPAIR       Medications: Current Outpatient Medications  Medication Sig Dispense Refill  . acetaminophen (TYLENOL) 650 MG CR tablet Take 1,300 mg by mouth 2 (two) times daily.     Marland Kitchen aspirin  EC 81 MG tablet Take 1 tablet (81 mg total) by mouth daily. 90 tablet 3  . Calcium Citrate-Vitamin D (CITRACAL + D PO) Take 1 tablet by mouth 2 (two) times daily.    Marland Kitchen FLUoxetine (PROZAC) 20 MG capsule TAKE 1 CAPSULE BY MOUTH EVERY DAY IN THE MORNING  1  . metoprolol succinate (TOPROL-XL) 25 MG 24 hr tablet TAKE ONE-HALF TABLET BY  MOUTH DAILY 45 tablet 1  . Multiple Vitamins-Minerals (MULTIVITAMIN WITH MINERALS) tablet Take 1 tablet by mouth daily. Centrum Silver    . nitroGLYCERIN (NITROSTAT) 0.4 MG SL tablet DISSOLVE 1 TABLET UNDER  TONGUE EVERY 5 MINUTES AS  NEEDED FOR CHEST PAIN  (MAXIMUM 3 TABLETS IN 15  MINUTES) 75 tablet 1  . rosuvastatin (CRESTOR) 5 MG tablet TAKE 1 TABLET BY MOUTH  EVERY OTHER DAY 45 tablet 1  . Turmeric 500 MG CAPS Take 1,000 mg by mouth daily.     . vitamin B-12 (CYANOCOBALAMIN) 1000 MCG tablet Take 1,000 mcg by mouth daily.    . vitamin C (ASCORBIC ACID) 500 MG tablet Take 500 mg by mouth daily.     No current facility-administered medications for this visit.     Allergies: Allergies  Allergen Reactions  . Sulfa Antibiotics Rash  . Atorvastatin Other (See Comments)    Joint pain  . Sulfonamide Derivatives Rash  Social History: The patient  reports that she has quit smoking. She has never used smokeless tobacco. She reports that she drinks alcohol. She reports that she does not use drugs.   Family History: The patient's family history includes Aortic stenosis in her father; Atrial fibrillation in her mother; Clotting disorder in her sister; Healthy in her brother and sister; Heart attack in her maternal grandfather; Heart failure in her maternal grandmother and mother; Hyperlipidemia in her father; Hypertension in her mother; Macular degeneration in her mother; Skin cancer in her mother; Stroke in her mother; Transient ischemic attack in her mother.   Review of Systems: Please see the history of present illness.   Otherwise, the review of systems is  positive for none.   All other systems are reviewed and negative.   Physical Exam: VS:  BP 110/62   Pulse 67   Ht 5\' 4"  (1.626 m)   Wt 116 lb 3.2 oz (52.7 kg)   BMI 19.95 kg/m  .  BMI Body mass index is 19.95 kg/m.  Wt Readings from Last 3 Encounters:  11/16/17 116 lb 3.2 oz (52.7 kg)  05/28/17 115 lb (52.2 kg)  05/04/17 117 lb (53.1 kg)   GENERAL:  Well appearing, thin WF in NAD HEENT:  PERRL, EOMI, sclera are clear. Oropharynx is clear. NECK:  No jugular venous distention, carotid upstroke brisk and symmetric, no bruits, no thyromegaly or adenopathy LUNGS:  Clear to auscultation bilaterally CHEST:  Unremarkable HEART:  RRR,  PMI not displaced or sustained,S1 and S2 within normal limits, no S3, no S4: no clicks, no rubs, no murmurs ABD:  Soft, nontender. BS +, no masses or bruits. No hepatomegaly, no splenomegaly EXT:  2 + pulses throughout, no edema, no cyanosis no clubbing SKIN:  Warm and dry.  No rashes NEURO:  Alert and oriented x 3. Cranial nerves II through XII intact. PSYCH:  Cognitively intact        LABORATORY DATA:   Lab Results  Component Value Date   WBC 8.0 08/26/2015   HGB 13.9 08/26/2015   HCT 39.1 08/26/2015   PLT 350 08/26/2015   GLUCOSE 80 07/22/2016   CHOL 104 07/22/2016   TRIG 47 07/22/2016   HDL 59 07/22/2016   LDLCALC 36 07/22/2016   ALT 8 07/22/2016   AST 17 07/22/2016   NA 141 07/22/2016   K 5.2 07/22/2016   CL 107 07/22/2016   CREATININE 0.74 07/22/2016   BUN 21 07/22/2016   CO2 26 07/22/2016   INR 1.19 07/23/2015    BNP (last 3 results) No results for input(s): BNP in the last 8760 hours.  ProBNP (last 3 results) No results for input(s): PROBNP in the last 8760 hours.   Other Studies Reviewed Today:  Ecg today shows NSR with normal Ecg. Rate 67. I have personally reviewed and interpreted this study.   Labs from 11/03/16: Cholesterol 132, trig 89, HDL 62, LDL 53. CMET and CBC normal.  Dated 09/13/17: normal Hgb and  chemistries.  Stress Echo 06/30/17: Study Conclusions  - Stress ECG conclusions: The stress ECG was normal. - Staged echo: Normal echo stress  Impressions:  - Normal study after maximal exercise.  Assessment/Plan: 1. CAD s/p NSTEMI Dec 2016 - s/p PCI to the mid LAD with DES - she is asymptomatic. Stress Echo in November was normal.  Continue ASA, metoprolol, low dose statin.  2.   HLD - on statin therapy. Await results of labs last week.   Current medicines are  reviewed with the patient today.  The patient does not have concerns regarding medicines other than what has been noted above.  The following changes have been made:  See above.  Labs/ tests ordered today include:    Orders Placed This Encounter  Procedures  . EKG 12-Lead     Disposition:   FU with me in 6 months    Signed: Shandrea Lusk Martinique MD, [facility]    11/16/2017 9:30 AM  [facility]

## 2017-11-16 ENCOUNTER — Ambulatory Visit: Payer: Medicare Other | Admitting: Cardiology

## 2017-11-16 ENCOUNTER — Encounter: Payer: Self-pay | Admitting: Cardiology

## 2017-11-16 VITALS — BP 110/62 | HR 67 | Ht 64.0 in | Wt 116.2 lb

## 2017-11-16 DIAGNOSIS — E785 Hyperlipidemia, unspecified: Secondary | ICD-10-CM

## 2017-11-16 DIAGNOSIS — I2510 Atherosclerotic heart disease of native coronary artery without angina pectoris: Secondary | ICD-10-CM | POA: Diagnosis not present

## 2017-11-16 NOTE — Patient Instructions (Addendum)
Continue your current therapy  I will see you in 6 months.   

## 2017-12-10 ENCOUNTER — Other Ambulatory Visit: Payer: Self-pay | Admitting: Family Medicine

## 2017-12-10 DIAGNOSIS — Z1231 Encounter for screening mammogram for malignant neoplasm of breast: Secondary | ICD-10-CM

## 2018-01-04 ENCOUNTER — Ambulatory Visit
Admission: RE | Admit: 2018-01-04 | Discharge: 2018-01-04 | Disposition: A | Discharge status: Home / Self Care | Payer: Medicare Other | Source: Ambulatory Visit | Attending: Family Medicine | Admitting: Family Medicine

## 2018-01-04 DIAGNOSIS — Z1231 Encounter for screening mammogram for malignant neoplasm of breast: Secondary | ICD-10-CM

## 2018-04-25 ENCOUNTER — Other Ambulatory Visit: Payer: Self-pay | Admitting: Cardiology

## 2018-05-02 ENCOUNTER — Other Ambulatory Visit: Payer: Self-pay | Admitting: Cardiology

## 2018-05-14 NOTE — Progress Notes (Signed)
CARDIOLOGY OFFICE NOTE  Date:  05/16/2018    [name] Date of Birth: 1950-12-01 Medical Record #[id number]  PCP:  Carol Ada, MD  Cardiologist:  Peter Martinique  MD  Chief Complaint  Patient presents with  . Coronary Artery Disease    History of Present Illness: Kaitlyn Lozano is a 67 y.o. female who is seen for follow up CAD.   She was admitted on 07/21/2015 for new-onset chest pain. LHC on 07/23/2015 showed 95% stenosis of the Mid-LAD. PCI performed with DES. She had residual 55-60% disease in OM1 and OM2.   Due to patient concern she underwent a stress Echo in November 2018 which was normal.   On follow up today she is doing well.  She is still eating healthy- follows a Zone diet. Walks 30 minutes a day 6 days a week. Denies any chest pain, SOB, palpitations. Only complaint is a little feeling of wooziness when she first drops off to sleep. She does note a little hip discomfort but is otherwise tolerating Crestor well.   Past Medical History:  Diagnosis Date  . Arthritis    OA  . Bronchitis    eosinophilic  . CAD (coronary artery disease) 07/21/2015   a. 07/2015: 95% stenosis of LAD -->2.5 mm x 16 mm Synergy DES placed, 60% stenosis 1st Mrg, 55% stenosis 2nd Mrg, EF normal  . Depression   . Depression   . GERD (gastroesophageal reflux disease)     Past Surgical History:  Procedure Laterality Date  . CARDIAC CATHETERIZATION N/A 07/23/2015   Procedure: Left Heart Cath and Coronary Angiography;  Surgeon: Leonie Man, MD;  Location: [place] [facility];  Service: Cardiovascular;  Laterality: N/A;  . CARDIAC CATHETERIZATION N/A 07/23/2015   Procedure: Coronary Stent Intervention;  Surgeon: Leonie Man, MD;  Location: [place] [facility];  Service: Cardiovascular;  Laterality: N/A;  . DILATION AND CURETTAGE OF UTERUS  [phone]  . dnc    . HERNIA REPAIR       Medications: Current Outpatient Medications  Medication Sig Dispense Refill  . acetaminophen  (TYLENOL) 650 MG CR tablet Take 1,300 mg by mouth 2 (two) times daily.     Marland Kitchen aspirin EC 81 MG tablet Take 1 tablet (81 mg total) by mouth daily. 90 tablet 3  . Calcium Citrate-Vitamin D (CITRACAL + D PO) Take 1 tablet by mouth 2 (two) times daily.    Marland Kitchen FLUoxetine (PROZAC) 20 MG capsule TAKE 1 CAPSULE BY MOUTH EVERY DAY IN THE MORNING  1  . metoprolol succinate (TOPROL-XL) 25 MG 24 hr tablet TAKE ONE-HALF TABLET BY  MOUTH DAILY 45 tablet 1  . Multiple Vitamins-Minerals (MULTIVITAMIN WITH MINERALS) tablet Take 1 tablet by mouth daily. Centrum Silver    . nitroGLYCERIN (NITROSTAT) 0.4 MG SL tablet DISSOLVE 1 TABLET UNDER  TONGUE EVERY 5 MINUTES AS  NEEDED FOR CHEST PAIN  (MAXIMUM 3 TABLETS IN 15  MINUTES) 75 tablet 1  . rosuvastatin (CRESTOR) 5 MG tablet TAKE 1 TABLET BY MOUTH  EVERY OTHER DAY 45 tablet 1  . Turmeric 500 MG CAPS Take 1,000 mg by mouth daily.     . vitamin B-12 (CYANOCOBALAMIN) 1000 MCG tablet Take 1,000 mcg by mouth daily.    . vitamin C (ASCORBIC ACID) 500 MG tablet Take 500 mg by mouth daily.     No current facility-administered medications for this visit.     Allergies: Allergies  Allergen Reactions  . Sulfa Antibiotics Rash  .  Atorvastatin Other (See Comments)    Joint pain  . Sulfonamide Derivatives Rash    Social History: The patient  reports that she has quit smoking. She has never used smokeless tobacco. She reports that she drinks alcohol. She reports that she does not use drugs.   Family History: The patient's family history includes Aortic stenosis in her father; Atrial fibrillation in her mother; Clotting disorder in her sister; Healthy in her brother and sister; Heart attack in her maternal grandfather; Heart failure in her maternal grandmother and mother; Hyperlipidemia in her father; Hypertension in her mother; Macular degeneration in her mother; Skin cancer in her mother; Stroke in her mother; Transient ischemic attack in her mother.   Review of  Systems: Please see the history of present illness.   Otherwise, the review of systems is positive for none.   All other systems are reviewed and negative.   Physical Exam: VS:  BP 120/60 (BP Location: Left Arm, Patient Position: Sitting, Cuff Size: Normal)   Pulse 66   Ht 5\' 4"  (1.626 m)   Wt 117 lb (53.1 kg)   BMI 20.08 kg/m  .  BMI Body mass index is 20.08 kg/m.  Wt Readings from Last 3 Encounters:  05/16/18 117 lb (53.1 kg)  11/16/17 116 lb 3.2 oz (52.7 kg)  05/28/17 115 lb (52.2 kg)   GENERAL:  Well appearing thin WF in NAD. HEENT:  PERRL, EOMI, sclera are clear. Oropharynx is clear. NECK:  No jugular venous distention, carotid upstroke brisk and symmetric, no bruits, no thyromegaly or adenopathy LUNGS:  Clear to auscultation bilaterally CHEST:  Unremarkable HEART:  RRR,  PMI not displaced or sustained,S1 and S2 within normal limits, no S3, no S4: no clicks, no rubs, no murmurs ABD:  Soft, nontender. BS +, no masses or bruits. No hepatomegaly, no splenomegaly EXT:  2 + pulses throughout, no edema, no cyanosis no clubbing SKIN:  Warm and dry.  No rashes NEURO:  Alert and oriented x 3. Cranial nerves II through XII intact. PSYCH:  Cognitively intact   LABORATORY DATA:   Lab Results  Component Value Date   WBC 8.0 08/26/2015   HGB 13.9 08/26/2015   HCT 39.1 08/26/2015   PLT 350 08/26/2015   GLUCOSE 80 07/22/2016   CHOL 104 07/22/2016   TRIG 47 07/22/2016   HDL 59 07/22/2016   LDLCALC 36 07/22/2016   ALT 8 07/22/2016   AST 17 07/22/2016   NA 141 07/22/2016   K 5.2 07/22/2016   CL 107 07/22/2016   CREATININE 0.74 07/22/2016   BUN 21 07/22/2016   CO2 26 07/22/2016   INR 1.19 07/23/2015    BNP (last 3 results) No results for input(s): BNP in the last 8760 hours.  ProBNP (last 3 results) No results for input(s): PROBNP in the last 8760 hours.   Other Studies Reviewed Today:  Ecg today shows NSR with normal Ecg. Rate 67. I have personally reviewed and  interpreted this study.   Labs from 11/03/16: Cholesterol 132, trig 89, HDL 62, LDL 53. CMET and CBC normal.  Dated 09/13/17: normal Hgb and chemistries. Dated 11/09/17: cholesterol 132, triglycerides 65, HDL 65, LDL 54, potassium 5.5. Otherwise Chemistries, CBC and TSH normal.   Stress Echo 06/30/17: Study Conclusions  - Stress ECG conclusions: The stress ECG was normal. - Staged echo: Normal echo stress  Impressions:  - Normal study after maximal exercise.  Assessment/Plan: 1. CAD s/p NSTEMI Dec 2016 - s/p PCI to the mid LAD  with DES - she remains asymptomatic. Stress Echo in November 2018 was normal.  Continue ASA, metoprolol, low dose statin.  2.   HLD - on statin therapy. Excellent control on low dose Crestor.   Current medicines are reviewed with the patient today.  The patient does not have concerns regarding medicines other than what has been noted above.  The following changes have been made:  See above.  Labs/ tests ordered today include:    No orders of the defined types were placed in this encounter.    Disposition:   FU with me in one year.    Signed: Peter Martinique MD, [facility]    05/16/2018 10:38 AM  [facility]

## 2018-05-16 ENCOUNTER — Encounter: Payer: Self-pay | Admitting: Cardiology

## 2018-05-16 ENCOUNTER — Ambulatory Visit: Payer: Medicare Other | Admitting: Cardiology

## 2018-05-16 VITALS — BP 120/60 | HR 66 | Ht 64.0 in | Wt 117.0 lb

## 2018-05-16 DIAGNOSIS — E785 Hyperlipidemia, unspecified: Secondary | ICD-10-CM | POA: Diagnosis not present

## 2018-05-16 DIAGNOSIS — I2510 Atherosclerotic heart disease of native coronary artery without angina pectoris: Secondary | ICD-10-CM

## 2018-05-16 MED ORDER — NITROGLYCERIN 0.4 MG SL SUBL: 0.4000 mg | SUBLINGUAL_TABLET | SUBLINGUAL | 1 refills | Status: DC | PRN

## 2018-05-16 MED ORDER — ROSUVASTATIN CALCIUM 5 MG PO TABS: 5.0000 mg | ORAL_TABLET | ORAL | 3 refills | Status: DC

## 2018-05-16 MED ORDER — METOPROLOL SUCCINATE ER 25 MG PO TB24: 12.5000 mg | ORAL_TABLET | Freq: Every day | ORAL | 3 refills | Status: DC

## 2018-05-16 MED ORDER — NITROGLYCERIN 0.4 MG SL SUBL: 0.4000 mg | SUBLINGUAL_TABLET | SUBLINGUAL | 11 refills | Status: DC | PRN

## 2018-08-26 ENCOUNTER — Encounter: Payer: Self-pay | Admitting: Physician Assistant

## 2018-08-26 ENCOUNTER — Ambulatory Visit: Payer: Medicare Other | Admitting: Physician Assistant

## 2018-08-26 ENCOUNTER — Encounter (INDEPENDENT_AMBULATORY_CARE_PROVIDER_SITE_OTHER): Payer: Self-pay

## 2018-08-26 VITALS — BP 120/62 | HR 73 | Ht 64.0 in | Wt 116.0 lb

## 2018-08-26 DIAGNOSIS — R079 Chest pain, unspecified: Secondary | ICD-10-CM

## 2018-08-26 DIAGNOSIS — I25119 Atherosclerotic heart disease of native coronary artery with unspecified angina pectoris: Secondary | ICD-10-CM | POA: Diagnosis not present

## 2018-08-26 NOTE — Progress Notes (Signed)
Cardiology Office Note    Date:  08/29/2018   ID:  Kaitlyn, Lozano 1950-10-23, MRN [id number]  PCP:  Carol Ada, MD  Cardiologist:  Dr. Martinique  Chief Complaint  Patient presents with  . Follow-up    seen for Dr. Martinique.     History of Present Illness:  Kaitlyn Lozano is a 68 y.o. female with PMH of CAD and GERD.  Patient was admitted in December 2016 for new onset chest pain.  Left heart cath on 07/23/2015 showed 95% mid LAD lesion treated with DES, residual 55 to 60% disease in OM1 and OM 2.  She had a stress echo in November 2018 which was normal.  Patient was last seen by Dr. Martinique on 05/16/2018 at which time she was doing well.  Patient recently presented to [facility] hospital was chest pain.  The symptom was alleviated by nitroglycerin.  Chest x-ray was normal.  EKG showed no acute changes.  D-dimer was negative.  Patient was offered and encouraged admission for continued monitoring and a stress test versus cardiac catheterization however she declined admission.  Troponin was negative x2.  CK-MB and CK were normal as well.  Hemoglobin was normal.  Renal function and electrolyte also normal.  BUN to creatinine ratio was 27.1.  Patient presents today for cardiology office visit.  She mentions both episode of chest pain occurred while she was driving.  She says her chest pain was substernal and it was reminiscent of the previous angina.  For the past 2 weeks, she has not had any more chest pain.  She does walk a mile several times a week.  Given the similarity of her recent chest pain with the previous angina, I did recommend a repeat stress echo.  We also discussed alternative such as treadmill Myoview however she is very hesitant for any radioactive exposure.   Past Medical History:  Diagnosis Date  . Arthritis    OA  . Bronchitis    eosinophilic  . CAD (coronary artery disease) 07/21/2015   a. 07/2015: 95% stenosis of LAD -->2.5 mm x 16 mm Synergy DES placed, 60% stenosis 1st  Mrg, 55% stenosis 2nd Mrg, EF normal  . Depression   . Depression   . GERD (gastroesophageal reflux disease)     Past Surgical History:  Procedure Laterality Date  . CARDIAC CATHETERIZATION N/A 07/23/2015   Procedure: Left Heart Cath and Coronary Angiography;  Surgeon: Leonie Man, MD;  Location: [facility];  Service: Cardiovascular;  Laterality: N/A;  . CARDIAC CATHETERIZATION N/A 07/23/2015   Procedure: Coronary Stent Intervention;  Surgeon: Leonie Man, MD;  Location: [facility];  Service: Cardiovascular;  Laterality: N/A;  . DILATION AND CURETTAGE OF UTERUS  [phone]  . dnc    . HERNIA REPAIR      Current Medications: Outpatient Medications Prior to Visit  Medication Sig Dispense Refill  . acetaminophen (TYLENOL) 650 MG CR tablet Take 1,300 mg by mouth 2 (two) times daily.     Marland Kitchen aspirin EC 81 MG tablet Take 1 tablet (81 mg total) by mouth daily. 90 tablet 3  . Calcium Citrate-Vitamin D (CITRACAL + D PO) Take 1 tablet by mouth 2 (two) times daily.    Marland Kitchen FLUoxetine (PROZAC) 20 MG capsule TAKE 1 CAPSULE BY MOUTH EVERY DAY IN THE MORNING  1  . metoprolol succinate (TOPROL-XL) 25 MG 24 hr tablet Take 0.5 tablets (12.5 mg total) by mouth daily. 45 tablet 3  .  Multiple Vitamins-Minerals (MULTIVITAMIN WITH MINERALS) tablet Take 1 tablet by mouth daily. Centrum Silver    . nitroGLYCERIN (NITROSTAT) 0.4 MG SL tablet Place 1 tablet (0.4 mg total) under the tongue every 5 (five) minutes as needed for chest pain. 25 tablet 11  . rosuvastatin (CRESTOR) 5 MG tablet Take 1 tablet (5 mg total) by mouth every other day. 45 tablet 3  . Turmeric 500 MG CAPS Take 1,000 mg by mouth daily.     . vitamin B-12 (CYANOCOBALAMIN) 1000 MCG tablet Take 1,000 mcg by mouth daily.    . vitamin C (ASCORBIC ACID) 500 MG tablet Take 500 mg by mouth daily.     No facility-administered medications prior to visit.      Allergies:   Sulfa antibiotics; Atorvastatin; and Sulfonamide derivatives     Social History   Socioeconomic History  . Marital status: Married    Spouse name: Not on file  . Number of children: Not on file  . Years of education: Not on file  . Highest education level: Not on file  Occupational History  . Not on file  Social Needs  . Financial resource strain: Not on file  . Food insecurity:    Worry: Not on file    Inability: Not on file  . Transportation needs:    Medical: Not on file    Non-medical: Not on file  Tobacco Use  . Smoking status: Former Research scientist (life sciences)  . Smokeless tobacco: Never Used  Substance and Sexual Activity  . Alcohol use: Yes    Comment: rare  . Drug use: No  . Sexual activity: Not on file  Lifestyle  . Physical activity:    Days per week: Not on file    Minutes per session: Not on file  . Stress: Not on file  Relationships  . Social connections:    Talks on phone: Not on file    Gets together: Not on file    Attends religious service: Not on file    Active member of club or organization: Not on file    Attends meetings of clubs or organizations: Not on file    Relationship status: Not on file  Other Topics Concern  . Not on file  Social History Narrative  . Not on file     Family History:  The patient's family history includes Aortic stenosis in her father; Atrial fibrillation in her mother; Clotting disorder in her sister; Healthy in her brother and sister; Heart attack in her maternal grandfather; Heart failure in her maternal grandmother and mother; Hyperlipidemia in her father; Hypertension in her mother; Macular degeneration in her mother; Skin cancer in her mother; Stroke in her mother; Transient ischemic attack in her mother.   ROS:   Please see the history of present illness.    ROS All other systems reviewed and are negative.   PHYSICAL EXAM:   VS:  BP 120/62   Pulse 73   Ht 5\' 4"  (1.626 m)   Wt 116 lb (52.6 kg)   BMI 19.91 kg/m    GEN: Well nourished, well developed, in no acute distress  HEENT: normal   Neck: no JVD, carotid bruits, or masses Cardiac: RRR; no murmurs, rubs, or gallops,no edema  Respiratory:  clear to auscultation bilaterally, normal work of breathing GI: soft, nontender, nondistended, + BS MS: no deformity or atrophy  Skin: warm and dry, no rash Neuro:  Alert and Oriented x 3, Strength and sensation are intact Psych: euthymic mood, full  affect  Wt Readings from Last 3 Encounters:  08/26/18 116 lb (52.6 kg)  05/16/18 117 lb (53.1 kg)  11/16/17 116 lb 3.2 oz (52.7 kg)      Studies/Labs Reviewed:   EKG:  EKG is ordered today.  The ekg ordered today demonstrates normal sinus rhythm without significant ST-T wave changes  Recent Labs: No results found for requested labs within last 8760 hours.   Lipid Panel    Component Value Date/Time   CHOL 104 07/22/2016 0940   TRIG 47 07/22/2016 0940   HDL 59 07/22/2016 0940   CHOLHDL 1.8 07/22/2016 0940   VLDL 9 07/22/2016 0940   LDLCALC 36 07/22/2016 0940    Additional studies/ records that were reviewed today include:   Stress echo 06/30/2017 Study Conclusions  - Stress ECG conclusions: The stress ECG was normal. - Staged echo: Normal echo stress  Impressions:  - Normal study after maximal exercise.     ASSESSMENT:    1. Chest pain, unspecified type   2. Coronary artery disease involving native coronary artery of native heart with angina pectoris ([place])      PLAN:  In order of problems listed above:  1. CAD: Patient recently had one episode of chest pain that is reminiscent of the previous angina.  Both occurred at rest while she was driving.  Will obtain outpatient stress echocardiogram.  If stress echo is normal, then she can follow-up with Dr. Martinique near the end of this year.    Medication Adjustments/Labs and Tests Ordered: Current medicines are reviewed at length with the patient today.  Concerns regarding medicines are outlined above.  Medication changes, Labs and Tests ordered today are  listed in the Patient Instructions below. Patient Instructions  Medication Instructions:  NO CHANGES- Your physician recommends that you continue on your current medications as directed. Please refer to the Current Medication list given to you today. If you need a refill on your cardiac medications before your next appointment, please call your pharmacy.  Labwork: NONE ORDERED When you have your labs (blood work) drawn today and your tests are completely normal, you will receive your results only by MyChart Message (if you have MyChart) -OR-  A paper copy in the mail.  If you have any lab test that is abnormal or we need to change your treatment, we will call you to review these results.  Testing/Procedures: Your physician has requested that you have a stress echocardiogram. Echocardiography is a painless test that uses sound waves to create images of your heart. This is done while exercising on the treadmill. It provides your doctor with information about the size and shape of your heart and how well your heart's chambers and valves are working. This procedure takes approximately one hour. There are no restrictions for this procedure. PLEASE HOLD YOUR METOPROLOL THE DAY BEFORE AND THE DAY OF THIS PROCEDURE. This will be performed at our [facility] location - [address]. Please follow instruction sheet as given.  Follow-Up: You will need a follow up appointment, WE WILL CALL YOU WITH THE RESULTS. IF THIS COMES BACK NORMAL-OK TO FOLLOW UP IN October, AS PLANNED.  You may see Peter Martinique, MD or one of the following Advanced Practice Providers on your designated Care Team:  Almyra Deforest, [place]   Fabian Sharp, PA-C   At [facility], you and your health needs are our priority.  As part of our continuing mission to provide you with exceptional heart care, we  have created designated Provider Care Teams.  These Care Teams include your primary Cardiologist (physician) and Advanced Practice  Providers (APPs -  Physician Assistants and Nurse Practitioners) who all work together to provide you with the care you need, when you need it.  Thank you for choosing [facility] at [facility], [place]  08/29/2018 12:30 AM    [address], [place], [place]  [address] Phone: [phone]; Fax: [phone]

## 2018-08-26 NOTE — Patient Instructions (Signed)
Medication Instructions:  NO CHANGES- Your physician recommends that you continue on your current medications as directed. Please refer to the Current Medication list given to you today. If you need a refill on your cardiac medications before your next appointment, please call your pharmacy.  Labwork: NONE ORDERED When you have your labs (blood work) drawn today and your tests are completely normal, you will receive your results only by MyChart Message (if you have MyChart) -OR-  A paper copy in the mail.  If you have any lab test that is abnormal or we need to change your treatment, we will call you to review these results.  Testing/Procedures: Your physician has requested that you have a stress echocardiogram. Echocardiography is a painless test that uses sound waves to create images of your heart. This is done while exercising on the treadmill. It provides your doctor with information about the size and shape of your heart and how well your heart's chambers and valves are working. This procedure takes approximately one hour. There are no restrictions for this procedure. PLEASE HOLD YOUR METOPROLOL THE DAY BEFORE AND THE DAY OF THIS PROCEDURE. This will be performed at our [facility] location - [address], [address]. Please follow instruction sheet as given.  Follow-Up: You will need a follow up appointment, WE WILL CALL YOU WITH THE RESULTS. IF THIS COMES BACK NORMAL-OK TO FOLLOW UP IN October, AS PLANNED.  You may see Peter Martinique, MD or one of the following Advanced Practice Providers on your designated Care Team:  Almyra Deforest, [place]   Fabian Sharp, PA-C   At [facility], you and your health needs are our priority.  As part of our continuing mission to provide you with exceptional heart care, we have created designated Provider Care Teams.  These Care Teams include your primary Cardiologist (physician) and Advanced Practice Providers (APPs -  Physician Assistants and Nurse Practitioners) who  all work together to provide you with the care you need, when you need it.  Thank you for choosing [facility] at [facility]!!

## 2018-08-29 ENCOUNTER — Encounter: Payer: Self-pay | Admitting: Physician Assistant

## 2018-09-02 ENCOUNTER — Telehealth (HOSPITAL_COMMUNITY): Payer: Self-pay | Admitting: *Deleted

## 2018-09-02 NOTE — Telephone Encounter (Signed)
Patient given detailed instructions per Stress Test Requisition Sheet for test on 09/07/18 at 2:00.Patient Notified to arrive 30 minutes early, and that it is imperative to arrive on time for appointment to keep from having the test rescheduled.  Patient verbalized understanding. Kaitlyn Lozano

## 2018-09-07 ENCOUNTER — Ambulatory Visit (HOSPITAL_COMMUNITY): Payer: Medicare Other | Attending: Cardiovascular Disease

## 2018-09-07 ENCOUNTER — Other Ambulatory Visit: Payer: Self-pay

## 2018-09-07 ENCOUNTER — Ambulatory Visit (HOSPITAL_COMMUNITY): Payer: Medicare Other

## 2018-09-07 DIAGNOSIS — R079 Chest pain, unspecified: Secondary | ICD-10-CM

## 2018-09-12 ENCOUNTER — Telehealth: Payer: Self-pay | Admitting: Cardiology

## 2018-09-12 ENCOUNTER — Telehealth: Payer: Self-pay | Admitting: Physician Assistant

## 2018-09-12 NOTE — Progress Notes (Signed)
Called to inform patient of ECHO results. She verbalized concern about the aortic valve having moderate leakage and that it's not severe enough to be treated given there is family history of aortic valve issues/replacements. She would like for Dr. Martinique to look at the results if he hasn't already and for someone to give her a call back to explain further why the need to not treat the leakage of the valve.

## 2018-09-12 NOTE — Telephone Encounter (Signed)
Discussed results of Echo with patient. I reviewed personally. Mild to moderate AI. Valve structurally looks normal. Will monitor.   Peter Martinique MD, [facility]

## 2018-09-12 NOTE — Progress Notes (Signed)
Left voice message for the patient to call back for her ECHO results

## 2018-09-12 NOTE — Telephone Encounter (Signed)
Pt aware of stress echo results and that has moderate AR and pt  wanted Dr Martinique to be aware.Will forward to Dr Martinique for review .Adonis Housekeeper

## 2018-09-12 NOTE — Telephone Encounter (Signed)
° ° °  Patient returning call for results. Please call patient back on mobile number

## 2019-02-15 ENCOUNTER — Telehealth: Payer: Self-pay | Admitting: Cardiology

## 2019-02-15 NOTE — Telephone Encounter (Signed)
02-15-19  LVM for patient to call and schedule 1 yr followup with Dr. Martinique.

## 2019-04-11 ENCOUNTER — Other Ambulatory Visit: Payer: Self-pay | Admitting: Cardiology

## 2019-05-08 ENCOUNTER — Other Ambulatory Visit: Payer: Self-pay | Admitting: Family Medicine

## 2019-05-08 DIAGNOSIS — Z1231 Encounter for screening mammogram for malignant neoplasm of breast: Secondary | ICD-10-CM

## 2019-05-18 ENCOUNTER — Ambulatory Visit: Payer: Medicare Other | Admitting: Cardiology

## 2019-05-18 ENCOUNTER — Telehealth: Payer: Medicare Other | Admitting: Cardiology

## 2019-05-29 NOTE — Progress Notes (Signed)
Cardiology Office Note   Date:  05/29/2019   ID:  Kaitlyn, Lozano 09-23-1950, MRN [id number]  PCP:  Carol Ada, MD  Cardiologist:  Dr. Martinique  CC: Follow Up   History of Present Illness: Kaitlyn Lozano is a 68 y.o. female who presents for ongoing assessment and management of coronary artery disease, most recent left heart catheterization on 07/23/2015 showed a 95% mid LAD lesion which was treated with a drug-eluting stent, there was residual 55 to 60% disease in the OM1 and OM 2.  A follow-up stress echocardiogram in November 2018 was normal.  Other history includes GERD, depression, and chronic bronchitis with osteoarthritis.  She was last seen in the office on 08/26/2018 by Almyra Deforest, PA, at which time she was complaining of chest pain, which occurred while driving.  It was not reminiscent of her previous angina symptoms.  She was recommended for repeat stress echo versus a stress Myoview.  She preferred to have a repeat stress echo as she was uncomfortable with radiation.  Stress echo on 09/07/2018 revealed an EF of 55% to 60%.  She was found to have grade 1 diastolic dysfunction.  There was moderate aortic valve regurgitation.  Stress EKG revealed no stress arrhythmias or conduction abnormalities.  There was no echocardiographic evidence for stress-induced ischemia.  Upon being given the results she had multiple questions concerning her aortic valve.  Dr. Martinique did call her back and explained that her aortic valve only revealed mild to moderate AI and was structurally normal and therefore no further intervention or testing would be necessary at that time.  Kaitlyn Lozano is without any complaints today.  She has no further chest discomfort dyspnea.  She has not had any new diagnoses or medications added to her regimen.  She continues to be medically compliant and follow-up with her primary care physician.  Past Medical History:  Diagnosis Date  . Arthritis    OA  . Bronchitis    eosinophilic   . CAD (coronary artery disease) 07/21/2015   a. 07/2015: 95% stenosis of LAD -->2.5 mm x 16 mm Synergy DES placed, 60% stenosis 1st Mrg, 55% stenosis 2nd Mrg, EF normal  . Depression   . Depression   . GERD (gastroesophageal reflux disease)     Past Surgical History:  Procedure Laterality Date  . CARDIAC CATHETERIZATION N/A 07/23/2015   Procedure: Left Heart Cath and Coronary Angiography;  Surgeon: Leonie Man, MD;  Location: [facility];  Service: Cardiovascular;  Laterality: N/A;  . CARDIAC CATHETERIZATION N/A 07/23/2015   Procedure: Coronary Stent Intervention;  Surgeon: Leonie Man, MD;  Location: [place];  Service: Cardiovascular;  Laterality: N/A;  . DILATION AND CURETTAGE OF UTERUS  [phone]  . dnc    . HERNIA REPAIR       Current Outpatient Medications  Medication Sig Dispense Refill  . acetaminophen (TYLENOL) 650 MG CR tablet Take 1,300 mg by mouth 2 (two) times daily.     Marland Kitchen aspirin EC 81 MG tablet Take 1 tablet (81 mg total) by mouth daily. 90 tablet 3  . Calcium Citrate-Vitamin D (CITRACAL + D PO) Take 1 tablet by mouth 2 (two) times daily.    Marland Kitchen FLUoxetine (PROZAC) 20 MG capsule TAKE 1 CAPSULE BY MOUTH EVERY DAY IN THE MORNING  1  . metoprolol succinate (TOPROL-XL) 25 MG 24 hr tablet TAKE ONE-HALF TABLET BY  MOUTH DAILY 45 tablet 0  . Multiple Vitamins-Minerals (MULTIVITAMIN WITH MINERALS) tablet  Take 1 tablet by mouth daily. Centrum Silver    . nitroGLYCERIN (NITROSTAT) 0.4 MG SL tablet Place 1 tablet (0.4 mg total) under the tongue every 5 (five) minutes as needed for chest pain. 25 tablet 11  . rosuvastatin (CRESTOR) 5 MG tablet TAKE 1 TABLET BY MOUTH  EVERY OTHER DAY 45 tablet 0  . Turmeric 500 MG CAPS Take 1,000 mg by mouth daily.     . vitamin B-12 (CYANOCOBALAMIN) 1000 MCG tablet Take 1,000 mcg by mouth daily.    . vitamin C (ASCORBIC ACID) 500 MG tablet Take 500 mg by mouth daily.     No current facility-administered medications for  this visit.     Allergies:   Sulfa antibiotics, Atorvastatin, and Sulfonamide derivatives    Social History:  The patient  reports that she has quit smoking. She has never used smokeless tobacco. She reports current alcohol use. She reports that she does not use drugs.   Family History:  The patient's family history includes Aortic stenosis in her father; Atrial fibrillation in her mother; Clotting disorder in her sister; Healthy in her brother and sister; Heart attack in her maternal grandfather; Heart failure in her maternal grandmother and mother; Hyperlipidemia in her father; Hypertension in her mother; Macular degeneration in her mother; Skin cancer in her mother; Stroke in her mother; Transient ischemic attack in her mother.    ROS: All other systems are reviewed and negative. Unless otherwise mentioned in H&P    PHYSICAL EXAM: VS:  There were no vitals taken for this visit. , BMI There is no height or weight on file to calculate BMI. GEN: Well nourished, well developed, in no acute distress HEENT: normal Neck: no JVD, carotid bruits, or masses Cardiac: RRR; no murmurs, rubs, or gallops,no edema  Respiratory:  Clear to auscultation bilaterally, normal work of breathing GI: soft, nontender, nondistended, + BS MS: no deformity or atrophy Skin: warm and dry, no rash Neuro:  Strength and sensation are intact Psych: euthymic mood, full affect   EKG: Normal sinus rhythm, heart rate of 66 bpm.  Recent Labs: No results found for requested labs within last 8760 hours.    Lipid Panel    Component Value Date/Time   CHOL 104 07/22/2016 0940   TRIG 47 07/22/2016 0940   HDL 59 07/22/2016 0940   CHOLHDL 1.8 07/22/2016 0940   VLDL 9 07/22/2016 0940   LDLCALC 36 07/22/2016 0940      Wt Readings from Last 3 Encounters:  08/26/18 116 lb (52.6 kg)  05/16/18 117 lb (53.1 kg)  11/16/17 116 lb 3.2 oz (52.7 kg)      Other studies Reviewed: Echocardiogram 09-20-18 - Left  ventricle: Systolic function was normal. The estimated   ejection fraction was in the range of 55% to 60%. Doppler   parameters are consistent with abnormal left ventricular   relaxation (grade 1 diastolic dysfunction). - Aortic valve: There was moderate regurgitation. Regurgitation   pressure half-time: 383 ms. - Stress ECG conclusions: There were no stress arrhythmias or   conduction abnormalities. - Staged echo: There was no echocardiographic evidence for   stress-induced ischemia.  Impressions:  - Negative, adequate stress test.  ASSESSMENT AND PLAN:  1.  Chest pain: She has no further complaints of discomfort.  She states she feels well and does not require any further testing.  2.  Coronary artery disease: Most recent catheterization in 2016 revealing 95% mid LAD lesion treated with a drug-eluting stent with residual 55 to  60% disease in the OM1 and OM 2.  Follow-up stress echocardiogram on 09/07/2018 revealed no echocardiographic evidence for stress-induced ischemia.  The patient will continue on secondary prevention with statin therapy aspirin and blood pressure control.  She is given refills on rosuvastatin and metoprolol.  3.  Hyperlipidemia: Fasting lipids LFTs are ordered today.  Primary care has already placed these orders and we will have them drawn in the office today.  Goal of LDL less than 70.    Current medicines are reviewed at length with the patient today.    Labs/ tests ordered today include: Fasting lipids LFTs, BMET, CBC, vitamin D.  (Per PCP). Phill Myron. [place] Pugh, ANP, AACC   05/29/2019 4:07 PM    [facility] [place] [address] Office [phone] Fax [phone]  Notice: This dictation was prepared with Dragon dictation along with smaller phrase technology. Any transcriptional errors that result from this process are unintentional and may not be corrected upon review.

## 2019-05-30 ENCOUNTER — Other Ambulatory Visit: Payer: Self-pay

## 2019-05-30 ENCOUNTER — Encounter: Payer: Self-pay | Admitting: Adult Health

## 2019-05-30 ENCOUNTER — Ambulatory Visit: Payer: Medicare Other | Admitting: Adult Health

## 2019-05-30 VITALS — BP 110/64 | HR 66 | Ht 64.0 in | Wt 116.4 lb

## 2019-05-30 DIAGNOSIS — Z1321 Encounter for screening for nutritional disorder: Secondary | ICD-10-CM | POA: Diagnosis not present

## 2019-05-30 DIAGNOSIS — I2510 Atherosclerotic heart disease of native coronary artery without angina pectoris: Secondary | ICD-10-CM | POA: Diagnosis not present

## 2019-05-30 DIAGNOSIS — E785 Hyperlipidemia, unspecified: Secondary | ICD-10-CM | POA: Diagnosis not present

## 2019-05-30 DIAGNOSIS — Z79899 Other long term (current) drug therapy: Secondary | ICD-10-CM

## 2019-05-30 MED ORDER — METOPROLOL SUCCINATE ER 25 MG PO TB24: 12.5000 mg | ORAL_TABLET | Freq: Every day | ORAL | 3 refills | Status: DC

## 2019-05-30 MED ORDER — ROSUVASTATIN CALCIUM 5 MG PO TABS: 5.0000 mg | ORAL_TABLET | ORAL | 3 refills | Status: DC

## 2019-05-30 NOTE — Patient Instructions (Signed)
Medication Instructions:  Continue current medications  *If you need a refill on your cardiac medications before your next appointment, please call your pharmacy*  Lab Work: CBC, BMP, Vit D, Fasting Lipid Liver If you have labs (blood work) drawn today and your tests are completely normal, you will receive your results only by: Marland Kitchen MyChart Message (if you have MyChart) OR . A paper copy in the mail If you have any lab test that is abnormal or we need to change your treatment, we will call you to review the results.  Testing/Procedures: None Ordered  Follow-Up: At [facility], you and your health needs are our priority.  As part of our continuing mission to provide you with exceptional heart care, we have created designated Provider Care Teams.  These Care Teams include your primary Cardiologist (physician) and Advanced Practice Providers (APPs -  Physician Assistants and Nurse Practitioners) who all work together to provide you with the care you need, when you need it.  Your next appointment:   12 months  The format for your next appointment:   In Person  Provider:   Peter Martinique, MD

## 2019-05-31 LAB — BASIC METABOLIC PANEL
BUN/Creatinine Ratio: 24 (ref 12–28)
BUN: 19 mg/dL (ref 8–27)
CO2: 27 mmol/L (ref 20–29)
Calcium: 9.4 mg/dL (ref 8.7–10.3)
Chloride: 103 mmol/L (ref 96–106)
Creatinine, Ser: 0.79 mg/dL (ref 0.57–1.00)
GFR calc Af Amer: 88 mL/min/{1.73_m2} (ref >59)
GFR calc non Af Amer: 77 mL/min/{1.73_m2} (ref >59)
Glucose: 80 mg/dL (ref 65–99)
Potassium: 4.4 mmol/L (ref 3.5–5.2)
Sodium: 140 mmol/L (ref 134–144)

## 2019-05-31 LAB — LIPID PANEL
Chol/HDL Ratio: 1.9 ratio (ref 0.0–4.4)
Cholesterol, Total: 128 mg/dL (ref 100–199)
HDL: 66 mg/dL (ref >39)
LDL Chol Calc (NIH): 49 mg/dL (ref 0–99)
Triglycerides: 61 mg/dL (ref 0–149)
VLDL Cholesterol Cal: 13 mg/dL (ref 5–40)

## 2019-05-31 LAB — CBC
Hematocrit: 38.7 % (ref 34.0–46.6)
Hemoglobin: 13.5 g/dL (ref 11.1–15.9)
MCH: 33.6 pg — ABNORMAL HIGH (ref 26.6–33.0)
MCHC: 34.9 g/dL (ref 31.5–35.7)
MCV: 96 fL (ref 79–97)
Platelets: 227 10*3/uL (ref 150–450)
RBC: 4.02 x10E6/uL (ref 3.77–5.28)
RDW: 11.8 % (ref 11.7–15.4)
WBC: 4.7 10*3/uL (ref 3.4–10.8)

## 2019-05-31 LAB — HEPATIC FUNCTION PANEL
ALT: 9 IU/L (ref 0–32)
AST: 18 IU/L (ref 0–40)
Albumin: 4.7 g/dL (ref 3.8–4.8)
Alkaline Phosphatase: 73 IU/L (ref 39–117)
Bilirubin Total: 0.9 mg/dL (ref 0.0–1.2)
Bilirubin, Direct: 0.23 mg/dL (ref 0.00–0.40)
Total Protein: 6.4 g/dL (ref 6.0–8.5)

## 2019-05-31 LAB — VITAMIN D 25 HYDROXY (VIT D DEFICIENCY, FRACTURES): Vit D, 25-Hydroxy: 51.5 ng/mL (ref 30.0–100.0)

## 2019-06-23 ENCOUNTER — Ambulatory Visit
Admission: RE | Admit: 2019-06-23 | Discharge: 2019-06-23 | Disposition: A | Discharge status: Home / Self Care | Payer: Medicare Other | Source: Ambulatory Visit | Attending: Family Medicine | Admitting: Family Medicine

## 2019-06-23 ENCOUNTER — Other Ambulatory Visit: Payer: Self-pay

## 2019-06-23 DIAGNOSIS — Z1231 Encounter for screening mammogram for malignant neoplasm of breast: Secondary | ICD-10-CM

## 2020-05-20 NOTE — Progress Notes (Signed)
Cardiology Office Note   Date:  05/24/2020   ID:  Kaitlyn, Lozano 08-09-1950, MRN [id number]  PCP:  Carol Ada, MD  Cardiologist:  Dr. Jaren Vanetten Martinique  CC: Follow Up   History of Present Illness: Kaitlyn Lozano is a 69 y.o. female who presents for ongoing assessment and management of coronary artery disease. Left heart catheterization on 07/23/2015 showed a 95% mid LAD lesion which was treated with a drug-eluting stent, there was residual 55 to 60% disease in the OM1 and OM 2.  A follow-up stress echocardiogram in November 2018 was normal.  Other history includes GERD, depression, and chronic bronchitis with osteoarthritis.  She was seen in the office on 08/26/2018 by Almyra Deforest, PA, at which time she was complaining of chest pain, which occurred while driving.  It was not reminiscent of her previous angina symptoms.  She was recommended for repeat stress echo versus a stress Myoview.  She preferred to have a repeat stress echo as she was uncomfortable with radiation.  Stress echo on 09/07/2018 revealed an EF of 55% to 60%.  She was found to have grade 1 diastolic dysfunction.  There was moderate aortic valve regurgitation.  Stress EKG revealed no stress arrhythmias or conduction abnormalities.  There was no echocardiographic evidence for stress-induced ischemia.    On follow up today she is doing well. Denies any chest pain, dyspnea, palpitations, dizziness. She exercises daily. She has been following a Zone diet for the past 5 years. She is caring for her 22 yo mother.     Past Medical History:  Diagnosis Date   Arthritis    OA   Bronchitis    eosinophilic   CAD (coronary artery disease) 07/21/2015   a. 07/2015: 95% stenosis of LAD -->2.5 mm x 16 mm Synergy DES placed, 60% stenosis 1st Mrg, 55% stenosis 2nd Mrg, EF normal   Depression    Depression    GERD (gastroesophageal reflux disease)     Past Surgical History:  Procedure Laterality Date   CARDIAC CATHETERIZATION N/A  07/23/2015   Procedure: Left Heart Cath and Coronary Angiography;  Surgeon: Leonie Man, MD;  Location: [facility];  Service: Cardiovascular;  Laterality: N/A;   CARDIAC CATHETERIZATION N/A 07/23/2015   Procedure: Coronary Stent Intervention;  Surgeon: Leonie Man, MD;  Location: [facility];  Service: Cardiovascular;  Laterality: N/A;   DILATION AND CURETTAGE OF UTERUS  [phone]   dnc     HERNIA REPAIR       Current Outpatient Medications  Medication Sig Dispense Refill   acetaminophen (TYLENOL) 650 MG CR tablet Take 1,300 mg by mouth 2 (two) times daily.      aspirin EC 81 MG tablet Take 1 tablet (81 mg total) by mouth daily. 90 tablet 3   Calcium Citrate-Vitamin D (CITRACAL + D PO) Take 1 tablet by mouth 2 (two) times daily.     DULoxetine (CYMBALTA) 20 MG capsule Take 40 mg by mouth 2 (two) times daily.     metoprolol succinate (TOPROL-XL) 25 MG 24 hr tablet Take 0.5 tablets (12.5 mg total) by mouth daily. 45 tablet 3   Multiple Vitamins-Minerals (MULTIVITAMIN WITH MINERALS) tablet Take 1 tablet by mouth daily. Centrum Silver     nitroGLYCERIN (NITROSTAT) 0.4 MG SL tablet Place 1 tablet (0.4 mg total) under the tongue every 5 (five) minutes as needed for chest pain. 25 tablet 11   rosuvastatin (CRESTOR) 5 MG tablet Take 1 tablet (5 mg  total) by mouth every other day. 45 tablet 3   Turmeric 500 MG CAPS Take 1,000 mg by mouth daily.      vitamin B-12 (CYANOCOBALAMIN) 1000 MCG tablet Take 1,000 mcg by mouth daily.     vitamin C (ASCORBIC ACID) 500 MG tablet Take 500 mg by mouth daily.     No current facility-administered medications for this visit.    Allergies:   Sulfa antibiotics, Atorvastatin, and Sulfonamide derivatives    Social History:  The patient  reports that she has quit smoking. She has never used smokeless tobacco. She reports current alcohol use. She reports that she does not use drugs.   Family History:  The patient's family  history includes Aortic stenosis in her father; Atrial fibrillation in her mother; Clotting disorder in her sister; Healthy in her brother and sister; Heart attack in her maternal grandfather; Heart failure in her maternal grandmother and mother; Hyperlipidemia in her father; Hypertension in her mother; Macular degeneration in her mother; Skin cancer in her mother; Stroke in her mother; Transient ischemic attack in her mother.    ROS: All other systems are reviewed and negative. Unless otherwise mentioned in H&P    PHYSICAL EXAM: VS:  BP 116/82    Pulse 81    Ht 5\' 4"  (1.626 m)    Wt 120 lb 3.2 oz (54.5 kg)    SpO2 97%    BMI 20.63 kg/m  , BMI Body mass index is 20.63 kg/m. GEN: Well nourished, well developed, in no acute distress HEENT: normal Neck: no JVD, carotid bruits, or masses Cardiac: RRR; no murmurs, rubs, or gallops,no edema  Respiratory:  Clear to auscultation bilaterally, normal work of breathing GI: soft, nontender, nondistended, + BS MS: no deformity or atrophy Skin: warm and dry, no rash Neuro:  Strength and sensation are intact Psych: euthymic mood, full affect   EKG: Normal sinus rhythm, heart rate of 81 bpm. Normal. I have personally reviewed and interpreted this study.   Recent Labs: 05/30/2019: ALT 9; BUN 19; Creatinine, Ser 0.79; Hemoglobin 13.5; Platelets 227; Potassium 4.4; Sodium 140    Lipid Panel    Component Value Date/Time   CHOL 128 05/30/2019 0934   TRIG 61 05/30/2019 0934   HDL 66 05/30/2019 0934   CHOLHDL 1.9 05/30/2019 0934   CHOLHDL 1.8 07/22/2016 0940   VLDL 9 07/22/2016 0940   LDLCALC 49 05/30/2019 0934    Dated 11/28/19: cholesterol 136, triglycerides 73, HDL 63, LDL 54. CBC, CMET and TSH normal   Wt Readings from Last 3 Encounters:  05/24/20 120 lb 3.2 oz (54.5 kg)  05/30/19 116 lb 6.4 oz (52.8 kg)  08/26/18 116 lb (52.6 kg)      Other studies Reviewed: Stress Echocardiogram 09/07/2018 - Left ventricle: Systolic function was  normal. The estimated   ejection fraction was in the range of 55% to 60%. Doppler   parameters are consistent with abnormal left ventricular   relaxation (grade 1 diastolic dysfunction). - Aortic valve: There was moderate regurgitation. Regurgitation   pressure half-time: 383 ms. - Stress ECG conclusions: There were no stress arrhythmias or   conduction abnormalities. - Staged echo: There was no echocardiographic evidence for   stress-induced ischemia.  Impressions:  - Negative, adequate stress test.  ASSESSMENT AND PLAN:  1.  Coronary artery disease: s/p catheterization in 2016 revealing 95% mid LAD lesion treated with a drug-eluting stent with residual 55 to 60% disease in the OM1 and OM 2.  Follow-up stress echocardiogram  on 09/07/2018 revealed no echocardiographic evidence for stress-induced ischemia.  She is asymptomatic. Continue statin, ASA, low dose metoprolol.   2.  Hyperlipidemia: at goal on current Crestor dose.   3. Aortic insufficiency. Moderate by Stress Echo in 09/2018. She has no murmur on exam. Normal pulse pressure. I suspect this was overestimated on Echo. Will monitor on exam.     Lynetta Tomczak Martinique MD, [facility]     05/24/2020 3:12 PM    [address] Office [phone] Fax [phone]

## 2020-05-21 ENCOUNTER — Other Ambulatory Visit: Payer: Self-pay | Admitting: Family Medicine

## 2020-05-21 DIAGNOSIS — Z1231 Encounter for screening mammogram for malignant neoplasm of breast: Secondary | ICD-10-CM

## 2020-05-24 ENCOUNTER — Other Ambulatory Visit: Payer: Self-pay

## 2020-05-24 ENCOUNTER — Encounter: Payer: Self-pay | Admitting: Cardiology

## 2020-05-24 ENCOUNTER — Ambulatory Visit: Payer: Medicare Other | Admitting: Cardiology

## 2020-05-24 VITALS — BP 116/82 | HR 81 | Ht 64.0 in | Wt 120.2 lb

## 2020-05-24 DIAGNOSIS — I2510 Atherosclerotic heart disease of native coronary artery without angina pectoris: Secondary | ICD-10-CM | POA: Diagnosis not present

## 2020-05-24 DIAGNOSIS — E785 Hyperlipidemia, unspecified: Secondary | ICD-10-CM | POA: Diagnosis not present

## 2020-05-27 ENCOUNTER — Other Ambulatory Visit: Payer: Self-pay | Admitting: Cardiology

## 2020-05-27 MED ORDER — NITROGLYCERIN 0.4 MG SL SUBL: 0.4000 mg | SUBLINGUAL_TABLET | SUBLINGUAL | 11 refills | Status: DC | PRN

## 2020-05-27 MED ORDER — ROSUVASTATIN CALCIUM 5 MG PO TABS
5.0000 mg | ORAL_TABLET | ORAL | 3 refills | Status: DC
Start: 2020-05-27 — End: 2021-04-21

## 2020-05-27 MED ORDER — METOPROLOL SUCCINATE ER 25 MG PO TB24
12.5000 mg | ORAL_TABLET | Freq: Every day | ORAL | 3 refills | Status: DC
Start: 2020-05-27 — End: 2021-04-21

## 2020-05-27 NOTE — Telephone Encounter (Signed)
*  STAT* If patient is at the pharmacy, call can be transferred to refill team.   1. Which medications need to be refilled? (please list name of each medication and dose if known)  metoprolol succinate (TOPROL-XL) 25 MG 24 hr tablet rosuvastatin (CRESTOR) 5 MG tablet nitroGLYCERIN (NITROSTAT) 0.4 MG SL tablet 2. Which pharmacy/location (including street and city if local pharmacy) is medication to be sent to?   [place], [place], [address]  [facility] [address],   3. Do they need a 30 day or 90 day supply? 90 day, nitroglycerin 30 day    Patient would like refills of metoprolol and rosuvastatin to go to [facility] for 90 day. She would like to [facility].

## 2020-07-08 ENCOUNTER — Ambulatory Visit
Admission: RE | Admit: 2020-07-08 | Discharge: 2020-07-08 | Disposition: A | Discharge status: Home / Self Care | Payer: Medicare Other | Source: Ambulatory Visit | Attending: Family Medicine | Admitting: Family Medicine

## 2020-07-08 ENCOUNTER — Other Ambulatory Visit: Payer: Self-pay

## 2020-07-08 DIAGNOSIS — Z1231 Encounter for screening mammogram for malignant neoplasm of breast: Secondary | ICD-10-CM

## 2020-09-03 DIAGNOSIS — M19049 Primary osteoarthritis, unspecified hand: Secondary | ICD-10-CM | POA: Diagnosis not present

## 2020-09-03 DIAGNOSIS — E7800 Pure hypercholesterolemia, unspecified: Secondary | ICD-10-CM | POA: Diagnosis not present

## 2020-09-03 DIAGNOSIS — I2510 Atherosclerotic heart disease of native coronary artery without angina pectoris: Secondary | ICD-10-CM | POA: Diagnosis not present

## 2020-10-01 DIAGNOSIS — L608 Other nail disorders: Secondary | ICD-10-CM | POA: Diagnosis not present

## 2020-10-01 DIAGNOSIS — L821 Other seborrheic keratosis: Secondary | ICD-10-CM | POA: Diagnosis not present

## 2020-10-03 DIAGNOSIS — R1084 Generalized abdominal pain: Secondary | ICD-10-CM | POA: Diagnosis not present

## 2020-10-07 ENCOUNTER — Other Ambulatory Visit: Payer: Self-pay | Admitting: General Surgery

## 2020-10-07 DIAGNOSIS — R109 Unspecified abdominal pain: Secondary | ICD-10-CM

## 2020-10-28 ENCOUNTER — Ambulatory Visit
Admission: RE | Admit: 2020-10-28 | Discharge: 2020-10-28 | Disposition: A | Discharge status: Home / Self Care | Payer: Medicare Other | Source: Ambulatory Visit | Attending: General Surgery | Admitting: General Surgery

## 2020-10-28 DIAGNOSIS — R109 Unspecified abdominal pain: Secondary | ICD-10-CM

## 2020-10-28 DIAGNOSIS — K8080 Other cholelithiasis without obstruction: Secondary | ICD-10-CM | POA: Diagnosis not present

## 2020-10-28 DIAGNOSIS — K8020 Calculus of gallbladder without cholecystitis without obstruction: Secondary | ICD-10-CM | POA: Diagnosis not present

## 2020-10-30 DIAGNOSIS — H2513 Age-related nuclear cataract, bilateral: Secondary | ICD-10-CM | POA: Diagnosis not present

## 2020-10-30 DIAGNOSIS — H43813 Vitreous degeneration, bilateral: Secondary | ICD-10-CM | POA: Diagnosis not present

## 2020-10-30 DIAGNOSIS — H04123 Dry eye syndrome of bilateral lacrimal glands: Secondary | ICD-10-CM | POA: Diagnosis not present

## 2020-12-02 DIAGNOSIS — R109 Unspecified abdominal pain: Secondary | ICD-10-CM | POA: Diagnosis not present

## 2020-12-04 ENCOUNTER — Telehealth: Payer: Self-pay | Admitting: Cardiology

## 2020-12-04 NOTE — Telephone Encounter (Signed)
Patient called to talk with Dr. Percival Spanish or nurse

## 2020-12-05 NOTE — Telephone Encounter (Signed)
Spoke with pt, she has developed muscle pain as she did when she was taking the lipitor. Okay given for patient to stop the crestor for a 2 week statin holiday. She will call back to let us know how her symptoms are after 2 weeks.

## 2020-12-05 NOTE — Telephone Encounter (Signed)
Patient was calling back to say that no one call her back on yesterday. She wants to talk bout the medication that she is on and the pain that it causing. The of the medication is rosuvastatin (CRESTOR) 5 MG tablet. Please advise

## 2020-12-16 DIAGNOSIS — I2510 Atherosclerotic heart disease of native coronary artery without angina pectoris: Secondary | ICD-10-CM | POA: Diagnosis not present

## 2020-12-16 DIAGNOSIS — Z1389 Encounter for screening for other disorder: Secondary | ICD-10-CM | POA: Diagnosis not present

## 2020-12-16 DIAGNOSIS — R1031 Right lower quadrant pain: Secondary | ICD-10-CM | POA: Diagnosis not present

## 2020-12-16 DIAGNOSIS — E7800 Pure hypercholesterolemia, unspecified: Secondary | ICD-10-CM | POA: Diagnosis not present

## 2020-12-16 DIAGNOSIS — Z0000 Encounter for general adult medical examination without abnormal findings: Secondary | ICD-10-CM | POA: Diagnosis not present

## 2020-12-19 ENCOUNTER — Other Ambulatory Visit: Payer: Self-pay | Admitting: Family Medicine

## 2020-12-19 DIAGNOSIS — E2839 Other primary ovarian failure: Secondary | ICD-10-CM

## 2020-12-27 DIAGNOSIS — S91059A Open bite, unspecified ankle, initial encounter: Secondary | ICD-10-CM | POA: Diagnosis not present

## 2020-12-27 DIAGNOSIS — L03113 Cellulitis of right upper limb: Secondary | ICD-10-CM | POA: Diagnosis not present

## 2021-01-09 ENCOUNTER — Ambulatory Visit
Admission: RE | Admit: 2021-01-09 | Discharge: 2021-01-09 | Disposition: A | Discharge status: Home / Self Care | Payer: Medicare Other | Source: Ambulatory Visit | Attending: Family Medicine | Admitting: Family Medicine

## 2021-01-09 ENCOUNTER — Other Ambulatory Visit: Payer: Self-pay

## 2021-01-09 DIAGNOSIS — M8589 Other specified disorders of bone density and structure, multiple sites: Secondary | ICD-10-CM | POA: Diagnosis not present

## 2021-01-09 DIAGNOSIS — E2839 Other primary ovarian failure: Secondary | ICD-10-CM

## 2021-01-09 DIAGNOSIS — Z780 Asymptomatic menopausal state: Secondary | ICD-10-CM | POA: Diagnosis not present

## 2021-02-12 DIAGNOSIS — E7800 Pure hypercholesterolemia, unspecified: Secondary | ICD-10-CM | POA: Diagnosis not present

## 2021-02-12 DIAGNOSIS — I2510 Atherosclerotic heart disease of native coronary artery without angina pectoris: Secondary | ICD-10-CM | POA: Diagnosis not present

## 2021-02-12 DIAGNOSIS — M19049 Primary osteoarthritis, unspecified hand: Secondary | ICD-10-CM | POA: Diagnosis not present

## 2021-03-05 DIAGNOSIS — L821 Other seborrheic keratosis: Secondary | ICD-10-CM | POA: Diagnosis not present

## 2021-04-21 ENCOUNTER — Other Ambulatory Visit: Payer: Self-pay | Admitting: Cardiology

## 2021-05-28 ENCOUNTER — Ambulatory Visit: Payer: Medicare Other | Admitting: Physician Assistant

## 2021-05-28 ENCOUNTER — Other Ambulatory Visit: Payer: Self-pay

## 2021-05-28 ENCOUNTER — Encounter: Payer: Self-pay | Admitting: Physician Assistant

## 2021-05-28 VITALS — BP 108/60 | HR 77 | Ht 64.0 in | Wt 126.4 lb

## 2021-05-28 DIAGNOSIS — I2510 Atherosclerotic heart disease of native coronary artery without angina pectoris: Secondary | ICD-10-CM

## 2021-05-28 DIAGNOSIS — E785 Hyperlipidemia, unspecified: Secondary | ICD-10-CM | POA: Diagnosis not present

## 2021-05-28 DIAGNOSIS — I351 Nonrheumatic aortic (valve) insufficiency: Secondary | ICD-10-CM

## 2021-05-28 MED ORDER — METOPROLOL SUCCINATE ER 25 MG PO TB24: 12.5000 mg | ORAL_TABLET | Freq: Every day | ORAL | 3 refills | Status: DC

## 2021-05-28 MED ORDER — ROSUVASTATIN CALCIUM 5 MG PO TABS: 5.0000 mg | ORAL_TABLET | Freq: Every day | ORAL | 3 refills | Status: DC

## 2021-05-28 NOTE — Patient Instructions (Signed)
Medication Instructions:  TAKE Crestor 5 mg daily *If you need a refill on your cardiac medications before your next appointment, please call your pharmacy*   Lab Work: Your physician recommends that you return for lab work in 3 months:  Fasting Lipid Panel-DO NOT EAT OR DRINK PAST MIDNIGHT. Okay to have water. Hepatic (Liver) Function Test  If you have labs (blood work) drawn today and your tests are completely normal, you will receive your results only by: MyChart Message (if you have MyChart) OR A paper copy in the mail If you have any lab test that is abnormal or we need to change your treatment, we will call you to review the results.  Testing/Procedures: NONE ordered at this time of appointment   Follow-Up: At [facility], you and your health needs are our priority.  As part of our continuing mission to provide you with exceptional heart care, we have created designated Provider Care Teams.  These Care Teams include your primary Cardiologist (physician) and Advanced Practice Providers (APPs -  Physician Assistants and Nurse Practitioners) who all work together to provide you with the care you need, when you need it.  Your next appointment:   1 year(s)  The format for your next appointment:   In Person  Provider:   Peter Martinique, MD  Other Instructions

## 2021-05-28 NOTE — Progress Notes (Signed)
Cardiology Office Note:    Date:  05/28/2021   ID:  Kaitlyn Lozano, DOB 09-Feb-1951, MRN [id number]  PCP:  Carol Ada, MD   [place] [facility] Cardiologist:  Peter Martinique, MD     Referring MD: Carol Ada, MD   Chief Complaint  Patient presents with   Follow-up    Seen for Dr. Martinique. Annual visit     History of Present Illness:    Kaitlyn Lozano is a 70 y.o. female with a hx of CAD, HLD and GERD.  Patient was admitted in December 2016 for new onset chest pain.  Left heart cath on 07/23/2015 showed 95% mid LAD lesion treated with DES, residual 55 to 60% disease in OM1 and OM 2.  She had a stress echo in November 2018 which was normal. Patient [facility] to [facility] on 08/11/2018 with chest pain.  The symptom was alleviated by nitroglycerin.  Chest x-ray was normal.  EKG showed no acute changes.  D-dimer was negative.  Patient was offered and encouraged admission for continued monitoring and a stress test versus cardiac catheterization however she declined admission.  Troponin was negative x2.  CK-MB and CK were normal as well.  Hemoglobin was normal.  Renal function and electrolyte also normal.  BUN to creatinine ratio was 27.1.  I last saw the patient in January 2020 and ordered a stress echo.  Stress echo in February 2020 revealed EF 55 to 60%, grade 1 DD, moderate aortic valve regurgitation, no echocardiographic finding for stress-induced ischemia.Marland Kitchen  Upon further examination by Dr. Martinique, it was felt that her aortic regurgitation is mild to moderate.  Patient presents today for follow-up.  She denies any chest pain or worsening dyspnea.  She is fairly active at home.  She watches her diet.  It is surprising that her LDL was borderline elevated at 78 during the last lab draw in May 2022.  Otherwise total cholesterol, HDL and triglyceride were all very well controlled.  Over the past several years, her cholesterol has always been very well controlled.  She has a joint pain in  her right hip and previously attributed to statin medication.  However I am not confident her symptom is truly coming from the statin medication.  Currently she is taking Crestor 5 mg every other day, I encouraged her to increase the Crestor to 5 mg daily dosing.  She will need a fasting lipid panel and LFT in 3 months.  Despite the fact that she has mild to moderate AI on the previous stress echo, on physical exam today, I did not hear significant heart murmur.  Given the fact that she is asymptomatic, I did not repeat echo.  Overall, she is doing quite well and can follow-up in 1 year.  I have refilled her metoprolol, Crestor and nitroglycerin  Past Medical History:  Diagnosis Date   Arthritis    OA   Bronchitis    eosinophilic   CAD (coronary artery disease) 07/21/2015   a. 07/2015: 95% stenosis of LAD -->2.5 mm x 16 mm Synergy DES placed, 60% stenosis 1st Mrg, 55% stenosis 2nd Mrg, EF normal   Depression    Depression    GERD (gastroesophageal reflux disease)     Past Surgical History:  Procedure Laterality Date   CARDIAC CATHETERIZATION N/A 07/23/2015   Procedure: Left Heart Cath and Coronary Angiography;  Surgeon: Leonie Man, MD;  Location: [facility];  Service: Cardiovascular;  Laterality: N/A;   CARDIAC CATHETERIZATION N/A  07/23/2015   Procedure: Coronary Stent Intervention;  Surgeon: Leonie Man, MD;  Location: [facility];  Service: Cardiovascular;  Laterality: N/A;   DILATION AND CURETTAGE OF UTERUS  [phone]   dnc     HERNIA REPAIR      Current Medications: Current Meds  Medication Sig   acetaminophen (TYLENOL) 650 MG CR tablet Take 1,300 mg by mouth 2 (two) times daily.   aspirin EC 81 MG tablet Take 1 tablet (81 mg total) by mouth daily.   Calcium Citrate-Vitamin D (CITRACAL + D PO) Take 1 tablet by mouth 2 (two) times daily.   DULoxetine (CYMBALTA) 20 MG capsule Take 40 mg by mouth 2 (two) times daily.   Multiple Vitamins-Minerals (MULTIVITAMIN  WITH MINERALS) tablet Take 1 tablet by mouth daily. Centrum Silver   nitroGLYCERIN (NITROSTAT) 0.4 MG SL tablet Place 1 tablet (0.4 mg total) under the tongue every 5 (five) minutes as needed for chest pain.   Turmeric 500 MG CAPS Take 1,000 mg by mouth daily.    vitamin B-12 (CYANOCOBALAMIN) 1000 MCG tablet Take 1,000 mcg by mouth daily.   vitamin C (ASCORBIC ACID) 500 MG tablet Take 500 mg by mouth daily.   [DISCONTINUED] metoprolol succinate (TOPROL-XL) 25 MG 24 hr tablet TAKE ONE-HALF TABLET BY  MOUTH DAILY   [DISCONTINUED] rosuvastatin (CRESTOR) 5 MG tablet TAKE 1 TABLET BY MOUTH  EVERY OTHER DAY     Allergies:   Sulfa antibiotics, Atorvastatin, and Sulfonamide derivatives   Social History   Socioeconomic History   Marital status: Married    Spouse name: Not on file   Number of children: Not on file   Years of education: Not on file   Highest education level: Not on file  Occupational History   Not on file  Tobacco Use   Smoking status: Former   Smokeless tobacco: Never  Vaping Use   Vaping Use: Never used  Substance and Sexual Activity   Alcohol use: Yes    Comment: rare   Drug use: No   Sexual activity: Not on file  Other Topics Concern   Not on file  Social History Narrative   Not on file   Social Determinants of Health   Financial Resource Strain: Not on file  Food Insecurity: Not on file  Transportation Needs: Not on file  Physical Activity: Not on file  Stress: Not on file  Social Connections: Not on file     Family History: The patient's family history includes Aortic stenosis in her father; Atrial fibrillation in her mother; Clotting disorder in her sister; Healthy in her brother and sister; Heart attack in her maternal grandfather; Heart failure in her maternal grandmother and mother; Hyperlipidemia in her father; Hypertension in her mother; Macular degeneration in her mother; Skin cancer in her mother; Stroke in her mother; Transient ischemic attack in  her mother.  ROS:   Please see the history of present illness.     All other systems reviewed and are negative.  EKGs/Labs/Other Studies Reviewed:    The following studies were reviewed today:  Echo 09/07/2018 LV EF: 55% -   60%  Study Conclusions   - Left ventricle: Systolic function was normal. The estimated    ejection fraction was in the range of 55% to 60%. Doppler    parameters are consistent with abnormal left ventricular    relaxation (grade 1 diastolic dysfunction).  - Aortic valve: There was moderate regurgitation. Regurgitation    pressure half-time: 383 ms.  -  Stress ECG conclusions: There were no stress arrhythmias or    conduction abnormalities.  - Staged echo: There was no echocardiographic evidence for    stress-induced ischemia.   Impressions:   - Negative, adequate stress test.    EKG:  EKG is ordered today.  The ekg ordered today demonstrates normal sinus rhythm, no significant ST-T wave changes.  T wave flattening noticed in V2, this is mild change compared to last year's EKG.  Recent Labs: No results found for requested labs within last 8760 hours.  Recent Lipid Panel    Component Value Date/Time   CHOL 128 05/30/2019 0934   TRIG 61 05/30/2019 0934   HDL 66 05/30/2019 0934   CHOLHDL 1.9 05/30/2019 0934   CHOLHDL 1.8 07/22/2016 0940   VLDL 9 07/22/2016 0940   LDLCALC 49 05/30/2019 0934     Risk Assessment/Calculations:           Physical Exam:    VS:  BP 108/60 (BP Location: Right Arm, Patient Position: Sitting, Cuff Size: Normal)   Pulse 77   Ht 5\' 4"  (1.626 m)   Wt 126 lb 6.4 oz (57.3 kg)   SpO2 98%   BMI 21.70 kg/m     Wt Readings from Last 3 Encounters:  05/28/21 126 lb 6.4 oz (57.3 kg)  05/24/20 120 lb 3.2 oz (54.5 kg)  05/30/19 116 lb 6.4 oz (52.8 kg)     GEN:  Well nourished, well developed in no acute distress HEENT: Normal NECK: No JVD; No carotid bruits LYMPHATICS: No lymphadenopathy CARDIAC: RRR, no murmurs, rubs,  gallops RESPIRATORY:  Clear to auscultation without rales, wheezing or rhonchi  ABDOMEN: Soft, non-tender, non-distended MUSCULOSKELETAL:  No edema; No deformity  SKIN: Warm and dry NEUROLOGIC:  Alert and oriented x 3 PSYCHIATRIC:  Normal affect   ASSESSMENT:    1. Coronary artery disease involving native coronary artery of native heart without angina pectoris   2. Hyperlipidemia LDL goal <70   3. Nonrheumatic aortic valve insufficiency    PLAN:    In order of problems listed above:  CAD: Previously underwent DES to mid LAD in December 2016.  Denies any chest discomfort.  Continue aspirin, beta-blocker and statin.  Hyperlipidemia: Last LDL obtained in May 2022 was borderline elevated to 78, will increase Crestor from 5 mg every other day to daily dosing.  Obtain fasting lipid panel in IFT in 3 months.  Aortic valve insufficiency: Mild to moderate AI noted during previous stress echo in 2020.  No significant heart murmur on physical exam.  No worsening dyspnea or chest discomfort.  Asymptomatic.         Medication Adjustments/Labs and Tests Ordered: Current medicines are reviewed at length with the patient today.  Concerns regarding medicines are outlined above.  Orders Placed This Encounter  Procedures   Hepatic function panel   Lipid panel   EKG 12-Lead   Meds ordered this encounter  Medications   metoprolol succinate (TOPROL-XL) 25 MG 24 hr tablet    Sig: Take 0.5 tablets (12.5 mg total) by mouth daily.    Dispense:  45 tablet    Refill:  3    Requesting 1 year supply   rosuvastatin (CRESTOR) 5 MG tablet    Sig: Take 1 tablet (5 mg total) by mouth daily.    Dispense:  90 tablet    Refill:  3    Change to daily new Rx    Patient Instructions  Medication Instructions:  TAKE Crestor 5  mg daily *If you need a refill on your cardiac medications before your next appointment, please call your pharmacy*   Lab Work: Your physician recommends that you return for lab  work in 3 months:  Fasting Lipid Panel-DO NOT EAT OR DRINK PAST MIDNIGHT. Okay to have water. Hepatic (Liver) Function Test  If you have labs (blood work) drawn today and your tests are completely normal, you will receive your results only by: MyChart Message (if you have MyChart) OR A paper copy in the mail If you have any lab test that is abnormal or we need to change your treatment, we will call you to review the results.  Testing/Procedures: NONE ordered at this time of appointment   Follow-Up: At [facility], you and your health needs are our priority.  As part of our continuing mission to provide you with exceptional heart care, we have created designated Provider Care Teams.  These Care Teams include your primary Cardiologist (physician) and Advanced Practice Providers (APPs -  Physician Assistants and Nurse Practitioners) who all work together to provide you with the care you need, when you need it.  Your next appointment:   1 year(s)  The format for your next appointment:   In Person  Provider:   Peter Martinique, MD  Other Instructions    Signed, Almyra Deforest, [place]  05/28/2021 8:54 AM    [place]

## 2021-06-02 ENCOUNTER — Telehealth: Payer: Self-pay | Admitting: Cardiology

## 2021-06-02 MED ORDER — NITROGLYCERIN 0.4 MG SL SUBL: 0.4000 mg | SUBLINGUAL_TABLET | SUBLINGUAL | 11 refills | Status: DC | PRN

## 2021-06-02 NOTE — Telephone Encounter (Signed)
*  STAT* If patient is at the pharmacy, call can be transferred to refill team.   1. Which medications need to be refilled? (please list name of each medication and dose if known)  nitroGLYCERIN (NITROSTAT) 0.4 MG SL tablet  2. Which pharmacy/location (including street and city if local pharmacy) is medication to be sent to? [id number] [place], [place] [address]  3. Do they need a 30 day or 90 day supply? 30 with refills  Patient saw Dr. Doug Sou PA Almyra Deforest and this medication was not refilled. Patient [facility] not [facility]

## 2021-06-11 DIAGNOSIS — I2510 Atherosclerotic heart disease of native coronary artery without angina pectoris: Secondary | ICD-10-CM | POA: Diagnosis not present

## 2021-06-11 DIAGNOSIS — E7800 Pure hypercholesterolemia, unspecified: Secondary | ICD-10-CM | POA: Diagnosis not present

## 2021-06-12 DIAGNOSIS — L821 Other seborrheic keratosis: Secondary | ICD-10-CM | POA: Diagnosis not present

## 2021-06-12 DIAGNOSIS — Z85828 Personal history of other malignant neoplasm of skin: Secondary | ICD-10-CM | POA: Diagnosis not present

## 2021-06-12 DIAGNOSIS — L650 Telogen effluvium: Secondary | ICD-10-CM | POA: Diagnosis not present

## 2021-06-12 DIAGNOSIS — D229 Melanocytic nevi, unspecified: Secondary | ICD-10-CM | POA: Diagnosis not present

## 2021-06-12 DIAGNOSIS — Z872 Personal history of diseases of the skin and subcutaneous tissue: Secondary | ICD-10-CM | POA: Diagnosis not present

## 2021-06-12 DIAGNOSIS — L814 Other melanin hyperpigmentation: Secondary | ICD-10-CM | POA: Diagnosis not present

## 2021-06-12 DIAGNOSIS — L659 Nonscarring hair loss, unspecified: Secondary | ICD-10-CM | POA: Diagnosis not present

## 2021-07-10 DIAGNOSIS — R7989 Other specified abnormal findings of blood chemistry: Secondary | ICD-10-CM | POA: Diagnosis not present

## 2021-08-28 ENCOUNTER — Other Ambulatory Visit: Payer: Self-pay

## 2021-08-28 DIAGNOSIS — E785 Hyperlipidemia, unspecified: Secondary | ICD-10-CM | POA: Diagnosis not present

## 2021-08-28 LAB — HEPATIC FUNCTION PANEL
ALT: 14 IU/L (ref 0–32)
AST: 19 IU/L (ref 0–40)
Albumin: 5 g/dL — ABNORMAL HIGH (ref 3.7–4.7)
Alkaline Phosphatase: 80 IU/L (ref 44–121)
Bilirubin Total: 0.9 mg/dL (ref 0.0–1.2)
Bilirubin, Direct: 0.26 mg/dL (ref 0.00–0.40)
Total Protein: 6.6 g/dL (ref 6.0–8.5)

## 2021-08-28 LAB — LIPID PANEL
Chol/HDL Ratio: 1.8 ratio (ref 0.0–4.4)
Cholesterol, Total: 121 mg/dL (ref 100–199)
HDL: 66 mg/dL (ref >39)
LDL Chol Calc (NIH): 42 mg/dL (ref 0–99)
Triglycerides: 56 mg/dL (ref 0–149)
VLDL Cholesterol Cal: 13 mg/dL (ref 5–40)

## 2021-08-29 ENCOUNTER — Other Ambulatory Visit: Payer: Self-pay | Admitting: Family Medicine

## 2021-08-29 DIAGNOSIS — Z1231 Encounter for screening mammogram for malignant neoplasm of breast: Secondary | ICD-10-CM

## 2021-09-09 ENCOUNTER — Other Ambulatory Visit: Payer: Self-pay

## 2021-09-09 ENCOUNTER — Ambulatory Visit
Admission: RE | Admit: 2021-09-09 | Discharge: 2021-09-09 | Disposition: A | Discharge status: Home / Self Care | Payer: Medicare Other | Source: Ambulatory Visit | Attending: Family Medicine | Admitting: Family Medicine

## 2021-09-09 DIAGNOSIS — Z1231 Encounter for screening mammogram for malignant neoplasm of breast: Secondary | ICD-10-CM

## 2021-11-04 DIAGNOSIS — H35372 Puckering of macula, left eye: Secondary | ICD-10-CM | POA: Diagnosis not present

## 2021-11-04 DIAGNOSIS — H2513 Age-related nuclear cataract, bilateral: Secondary | ICD-10-CM | POA: Diagnosis not present

## 2021-11-04 DIAGNOSIS — H43813 Vitreous degeneration, bilateral: Secondary | ICD-10-CM | POA: Diagnosis not present

## 2021-11-04 DIAGNOSIS — H04123 Dry eye syndrome of bilateral lacrimal glands: Secondary | ICD-10-CM | POA: Diagnosis not present

## 2022-01-14 DIAGNOSIS — Z0000 Encounter for general adult medical examination without abnormal findings: Secondary | ICD-10-CM | POA: Diagnosis not present

## 2022-01-14 DIAGNOSIS — E7800 Pure hypercholesterolemia, unspecified: Secondary | ICD-10-CM | POA: Diagnosis not present

## 2022-01-14 DIAGNOSIS — I2510 Atherosclerotic heart disease of native coronary artery without angina pectoris: Secondary | ICD-10-CM | POA: Diagnosis not present

## 2022-01-14 DIAGNOSIS — R790 Abnormal level of blood mineral: Secondary | ICD-10-CM | POA: Diagnosis not present

## 2022-02-27 IMAGING — MG MM DIGITAL SCREENING BILAT W/ TOMO AND CAD
8 series · 9 of 24 positions shown · non-contrast
Comparison: Previous exam(s).

CLINICAL DATA: Screening.

EXAM:
DIGITAL SCREENING BILATERAL MAMMOGRAM WITH TOMOSYNTHESIS AND CAD
TECHNIQUE: Bilateral screening digital craniocaudal and mediolateral oblique
mammograms were obtained. Bilateral screening digital breast
tomosynthesis was performed. The images were evaluated with
computer-aided detection.

[R CC synth-2D]
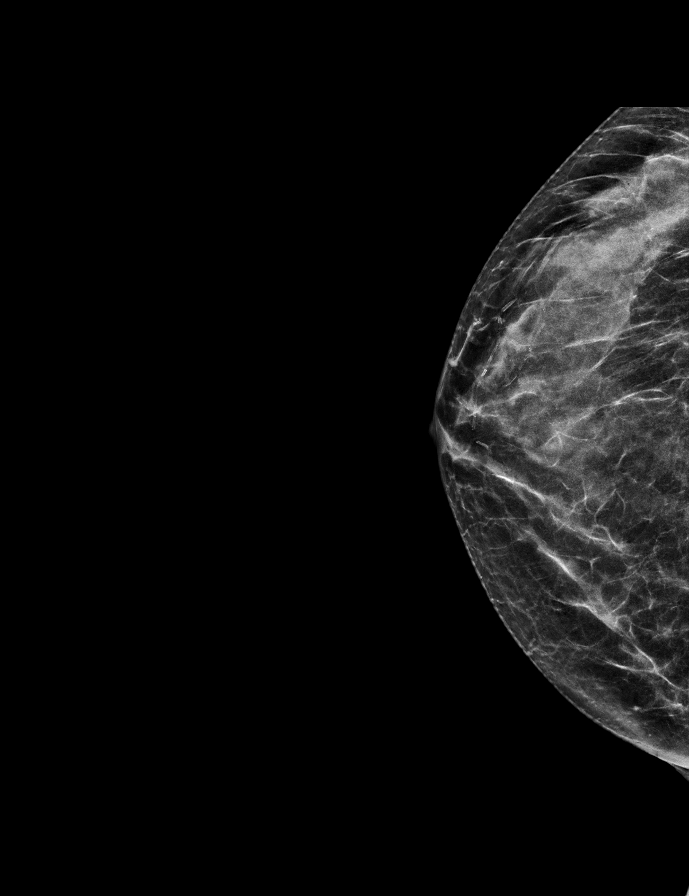

[L MLO synth-2D]
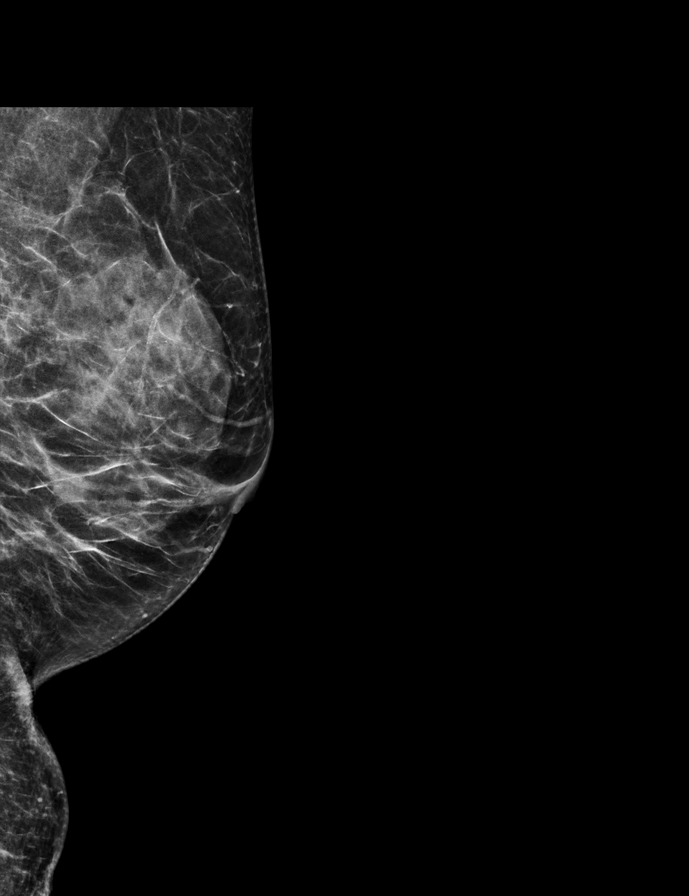

[R MLO synth-2D]
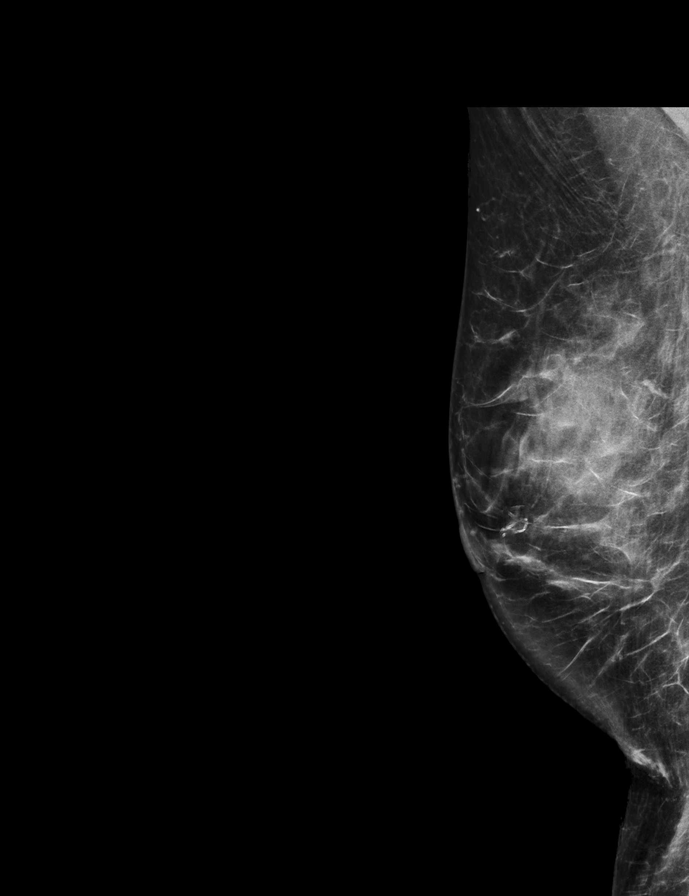

[L CC synth-2D]
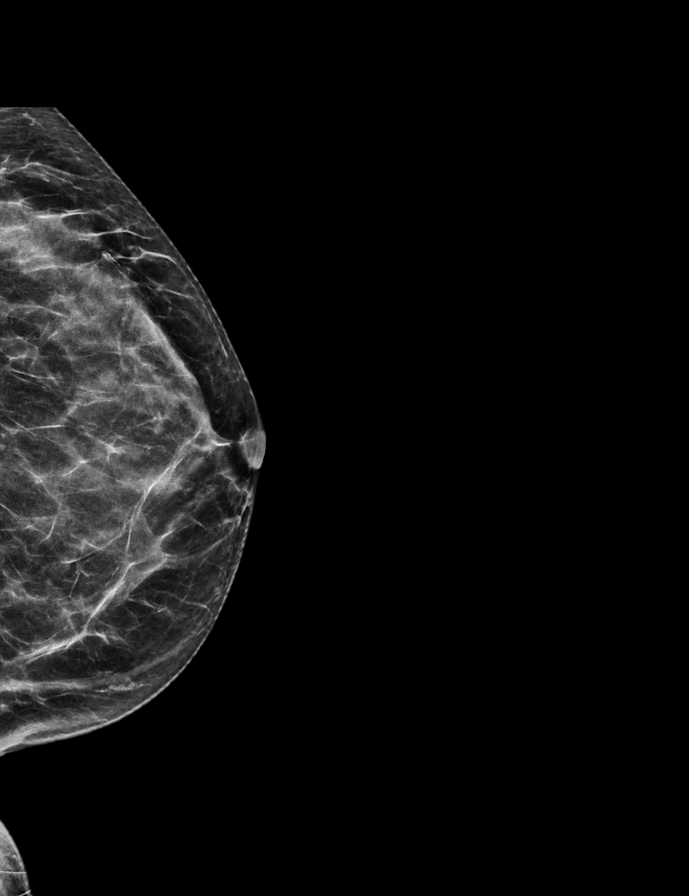

[R MLO tomo · 2 of 69 frames shown]
[frame 23/69]
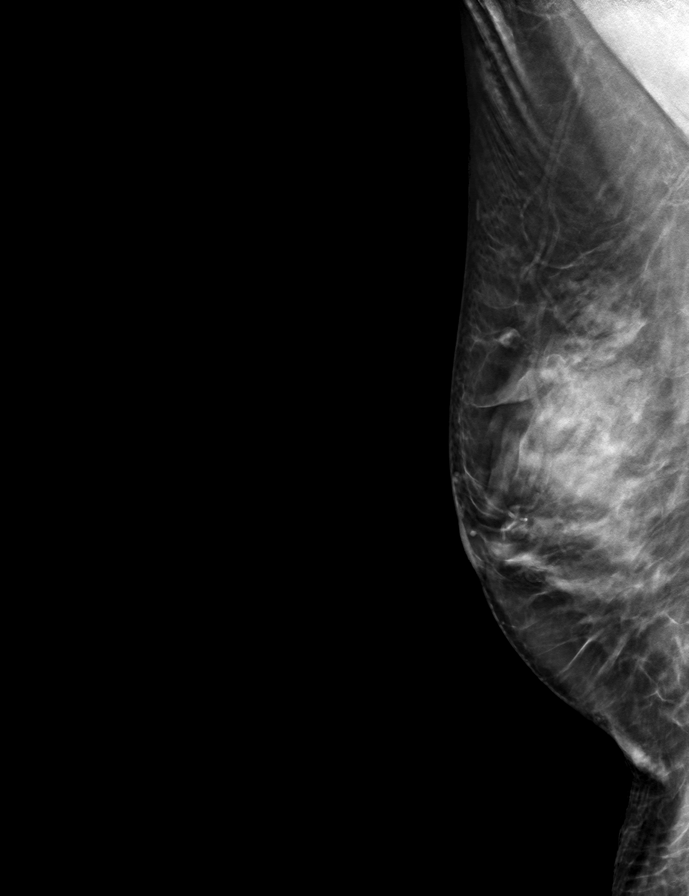
[frame 35/69]
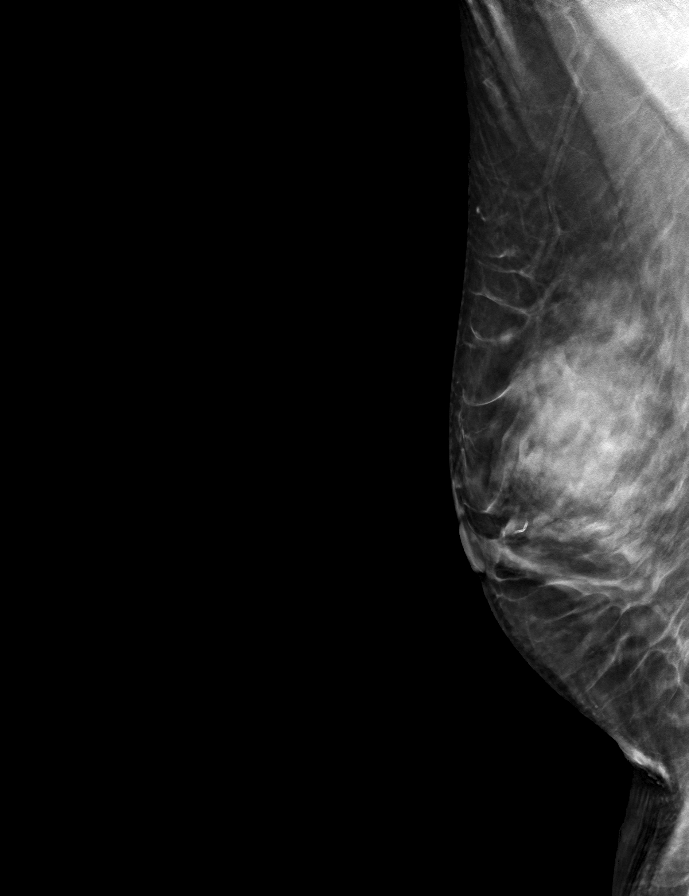

[L MLO tomo · tomo slice 25/50.0]
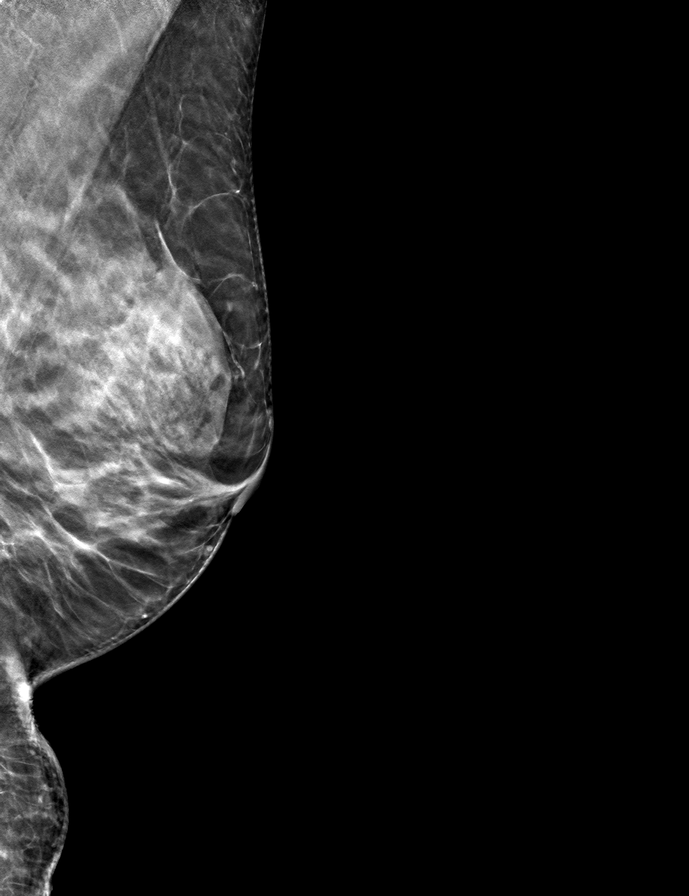

[R CC tomo · tomo slice 24/47.0]
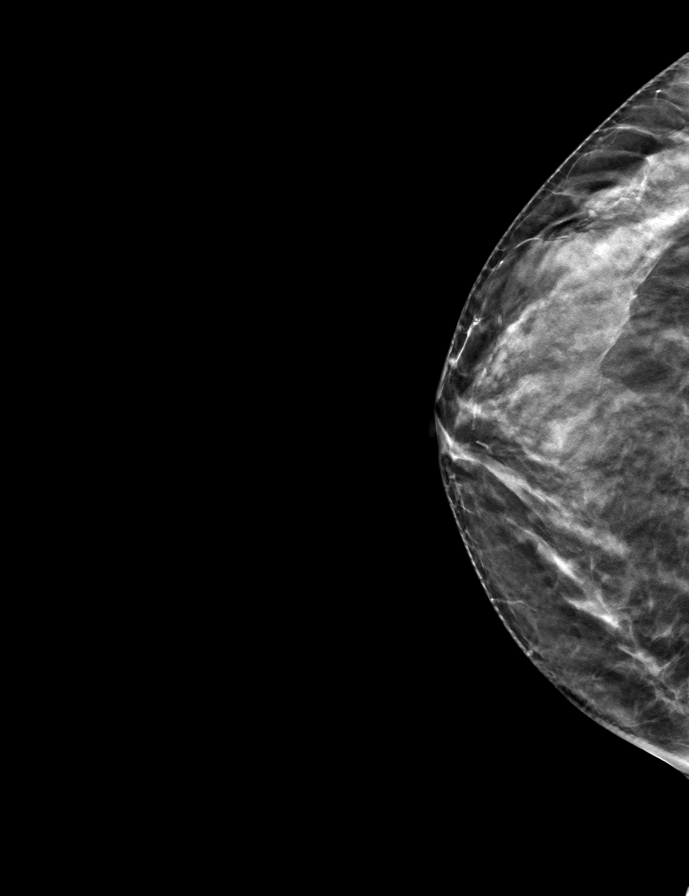

[L CC tomo · tomo slice 25/48.0]
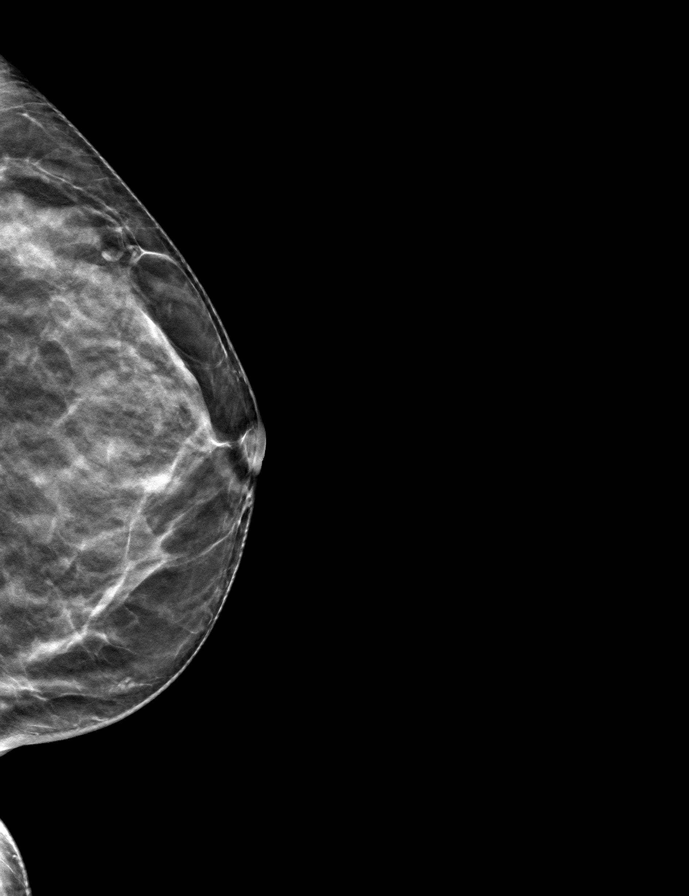

[9 of 24 positions shown; findings below may reference images not displayed]

ACR Breast Density Category c: The breast tissue is heterogeneously
dense, which may obscure small masses.
FINDINGS: There are no findings suspicious for malignancy.
IMPRESSION: No mammographic evidence of malignancy. A result letter of this
screening mammogram will be mailed directly to the patient.

RECOMMENDATION:
Screening mammogram in one year. (Code:Q3-W-BC3)

BI-RADS CATEGORY  1: Negative.

## 2022-04-18 ENCOUNTER — Other Ambulatory Visit: Payer: Self-pay | Admitting: Physician Assistant

## 2022-05-26 NOTE — Progress Notes (Unsigned)
Cardiology Office Note:    Date:  06/09/2022   ID:  Kaitlyn Lozano, DOB April 11, 1951, MRN [id number]  PCP:  Carol Ada, MD   [facility] Providers Cardiologist:  Fredia Chittenden [place], MD     Referring MD: Carol Ada, MD   Chief Complaint  Patient presents with   Coronary Artery Disease     History of Present Illness:    Kaitlyn Lozano is a 71 y.o. female with a hx of CAD, HLD and GERD.  Patient was admitted in December 2016 for new onset chest pain.  Cardiac cath on 07/23/2015 showed 95% mid LAD lesion treated with DES, residual 55 to 60% disease in OM1 and OM 2.  She had a stress echo in November 2018 which was normal. Patient presented to [facility] on 08/11/2018 with chest pain.  The symptom was alleviated by nitroglycerin.  Chest x-ray was normal.  EKG showed no acute changes.  D-dimer was negative.  Patient was offered and encouraged admission for continued monitoring and a stress test versus cardiac catheterization however she declined admission.  Troponin was negative x2.  CK-MB and CK were normal as well.  Hemoglobin was normal.  Renal function and electrolyte also normal.  BUN to creatinine ratio was 27.1.  Stress echo in February 2020 revealed EF 55 to 60%, grade 1 DD, moderate aortic valve regurgitation, no echocardiographic finding for stress-induced ischemia. On my review I felt that her aortic regurgitation is mild to moderate.  On follow up today she is doing very well. Exercises regularly swimming and doing water aerobics 3 days a week and walking the other days. Has a new puppy. Denies any chest pain, dyspnea or palpitations. Was diagnosed with high ferritin level. Was checked for hemachromatosis but this was negative.   Past Medical History:  Diagnosis Date   Arthritis    OA   Bronchitis    eosinophilic   CAD (coronary artery disease) 07/21/2015   a. 07/2015: 95% stenosis of LAD -->2.5 mm x 16 mm Synergy DES placed, 60% stenosis 1st Mrg, 55% stenosis 2nd Mrg, EF  normal   Depression    Depression    GERD (gastroesophageal reflux disease)     Past Surgical History:  Procedure Laterality Date   CARDIAC CATHETERIZATION N/A 07/23/2015   Procedure: Left Heart Cath and Coronary Angiography;  Surgeon: Leonie Man, MD;  Location: [place] [facility];  Service: Cardiovascular;  Laterality: N/A;   CARDIAC CATHETERIZATION N/A 07/23/2015   Procedure: Coronary Stent Intervention;  Surgeon: Leonie Man, MD;  Location: [place] [facility];  Service: Cardiovascular;  Laterality: N/A;   DILATION AND CURETTAGE OF UTERUS  [phone]   dnc     HERNIA REPAIR      Current Medications: Current Meds  Medication Sig   acetaminophen (TYLENOL) 650 MG CR tablet Take 1,300 mg by mouth 2 (two) times daily.   aspirin EC 81 MG tablet Take 1 tablet (81 mg total) by mouth daily.   Calcium Citrate-Vitamin D (CITRACAL + D PO) Take 1 tablet by mouth 2 (two) times daily.   DULoxetine (CYMBALTA) 20 MG capsule Take 40 mg by mouth 2 (two) times daily.   metoprolol succinate (TOPROL-XL) 25 MG 24 hr tablet Take 0.5 tablets (12.5 mg total) by mouth daily.   nitroGLYCERIN (NITROSTAT) 0.4 MG SL tablet Place 1 tablet (0.4 mg total) under the tongue every 5 (five) minutes as needed for chest pain.   rosuvastatin (CRESTOR) 5 MG tablet TAKE 1 TABLET  BY MOUTH DAILY   Turmeric 500 MG CAPS Take 1,000 mg by mouth daily.    vitamin B-12 (CYANOCOBALAMIN) 1000 MCG tablet Take 1,000 mcg by mouth daily.   vitamin C (ASCORBIC ACID) 500 MG tablet Take 500 mg by mouth daily.     Allergies:   Sulfa antibiotics, Atorvastatin, and Sulfonamide derivatives   Social History   Socioeconomic History   Marital status: Married    Spouse name: Not on file   Number of children: Not on file   Years of education: Not on file   Highest education level: Not on file  Occupational History   Not on file  Tobacco Use   Smoking status: Former   Smokeless tobacco: Never  Vaping Use   Vaping Use: Never  used  Substance and Sexual Activity   Alcohol use: Yes    Comment: rare   Drug use: No   Sexual activity: Not on file  Other Topics Concern   Not on file  Social History Narrative   Not on file   Social Determinants of Health   Financial Resource Strain: Not on file  Food Insecurity: Not on file  Transportation Needs: Not on file  Physical Activity: Not on file  Stress: Not on file  Social Connections: Not on file     Family History: The patient's family history includes Aortic stenosis in her father; Atrial fibrillation in her mother; Clotting disorder in her sister; Healthy in her brother and sister; Heart attack in her maternal grandfather; Heart failure in her maternal grandmother and mother; Hyperlipidemia in her father; Hypertension in her mother; Macular degeneration in her mother; Skin cancer in her mother; Stroke in her mother; Transient ischemic attack in her mother.  ROS:   Please see the history of present illness.     All other systems reviewed and are negative.  EKGs/Labs/Other Studies Reviewed:    The following studies were reviewed today:  Echo 09/07/2018 LV EF: 55% -   60%  Study Conclusions   - Left ventricle: Systolic function was normal. The estimated    ejection fraction was in the range of 55% to 60%. Doppler    parameters are consistent with abnormal left ventricular    relaxation (grade 1 diastolic dysfunction).  - Aortic valve: There was moderate regurgitation. Regurgitation    pressure half-time: 383 ms.  - Stress ECG conclusions: There were no stress arrhythmias or    conduction abnormalities.  - Staged echo: There was no echocardiographic evidence for    stress-induced ischemia.   Impressions:   - Negative, adequate stress test.    EKG:  EKG is ordered today.  The ekg ordered today demonstrates normal sinus rhythm, rate 77.  Normal. I have personally reviewed and interpreted this study.   Recent Labs: 08/28/2021: ALT 14  Recent Lipid  Panel    Component Value Date/Time   CHOL 121 08/28/2021 0853   TRIG 56 08/28/2021 0853   HDL 66 08/28/2021 0853   CHOLHDL 1.8 08/28/2021 0853   CHOLHDL 1.8 07/22/2016 0940   VLDL 9 07/22/2016 0940   LDLCALC 42 08/28/2021 0853   Dated 01/14/22: cholesterol 138, triglycerides 74, HDL 69, LDL 54. CBC, CMET, TSH normal.   Risk Assessment/Calculations:           Physical Exam:    VS:  BP 126/60   Pulse 77   Ht '5\' 4"'$  (1.626 m)   Wt 126 lb 12.8 oz (57.5 kg)   SpO2 96%   BMI  21.77 kg/m     Wt Readings from Last 3 Encounters:  06/09/22 126 lb 12.8 oz (57.5 kg)  05/28/21 126 lb 6.4 oz (57.3 kg)  05/24/20 120 lb 3.2 oz (54.5 kg)     GEN:  Well nourished, well developed in no acute distress HEENT: Normal NECK: No JVD; No carotid bruits LYMPHATICS: No lymphadenopathy CARDIAC: RRR, no murmurs, rubs, gallops RESPIRATORY:  Clear to auscultation without rales, wheezing or rhonchi  ABDOMEN: Soft, non-tender, non-distended MUSCULOSKELETAL:  No edema; No deformity  SKIN: Warm and dry NEUROLOGIC:  Alert and oriented x 3 PSYCHIATRIC:  Normal affect   ASSESSMENT:    1. Hyperlipidemia LDL goal <70   2. Coronary artery disease involving native coronary artery of native heart without angina pectoris   3. Nonrheumatic aortic valve insufficiency     PLAN:    In order of problems listed above:  CAD: Previously underwent DES to mid LAD in December 2016.  Denies any chest discomfort.  Stress Echo in 2020 was negative for ischemia. Continue aspirin, beta-blocker and statin.  Hyperlipidemia: LDL 54 on lab in June  Aortic valve insufficiency: Mild to moderate AI noted during previous stress echo in 2020.  No significant heart murmur on physical exam.  No worsening dyspnea or chest discomfort.  Asymptomatic.         Medication Adjustments/Labs and Tests Ordered: Current medicines are reviewed at length with the patient today.  Concerns regarding medicines are outlined above.  No  orders of the defined types were placed in this encounter.  Meds ordered this encounter  Medications   nitroGLYCERIN (NITROSTAT) 0.4 MG SL tablet    Sig: Place 1 tablet (0.4 mg total) under the tongue every 5 (five) minutes as needed for chest pain.    Dispense:  25 tablet    Refill:  6    There are no Patient Instructions on file for this visit.   Signed, Sagar Tengan [place], MD  06/09/2022 9:00 AM    [place]

## 2022-06-08 ENCOUNTER — Ambulatory Visit: Payer: Medicare Other | Admitting: Cardiology

## 2022-06-09 ENCOUNTER — Encounter: Payer: Self-pay | Admitting: Cardiology

## 2022-06-09 ENCOUNTER — Ambulatory Visit: Payer: Medicare Other | Attending: Cardiology | Admitting: Cardiology

## 2022-06-09 VITALS — BP 126/60 | HR 77 | Ht 64.0 in | Wt 126.8 lb

## 2022-06-09 DIAGNOSIS — E785 Hyperlipidemia, unspecified: Secondary | ICD-10-CM

## 2022-06-09 DIAGNOSIS — I351 Nonrheumatic aortic (valve) insufficiency: Secondary | ICD-10-CM | POA: Diagnosis not present

## 2022-06-09 DIAGNOSIS — I2510 Atherosclerotic heart disease of native coronary artery without angina pectoris: Secondary | ICD-10-CM | POA: Diagnosis not present

## 2022-06-09 MED ORDER — NITROGLYCERIN 0.4 MG SL SUBL: 0.4000 mg | SUBLINGUAL_TABLET | SUBLINGUAL | 6 refills | Status: DC | PRN

## 2022-06-09 NOTE — Addendum Note (Signed)
Addended by: Kathyrn Lass on: 06/09/2022 09:03 AM   Modules accepted: Orders

## 2022-06-12 DIAGNOSIS — L821 Other seborrheic keratosis: Secondary | ICD-10-CM | POA: Diagnosis not present

## 2022-06-12 DIAGNOSIS — Z85828 Personal history of other malignant neoplasm of skin: Secondary | ICD-10-CM | POA: Diagnosis not present

## 2022-06-12 DIAGNOSIS — D1801 Hemangioma of skin and subcutaneous tissue: Secondary | ICD-10-CM | POA: Diagnosis not present

## 2022-06-12 DIAGNOSIS — L814 Other melanin hyperpigmentation: Secondary | ICD-10-CM | POA: Diagnosis not present

## 2022-06-12 DIAGNOSIS — D229 Melanocytic nevi, unspecified: Secondary | ICD-10-CM | POA: Diagnosis not present

## 2022-06-12 DIAGNOSIS — Z872 Personal history of diseases of the skin and subcutaneous tissue: Secondary | ICD-10-CM | POA: Diagnosis not present

## 2022-06-12 DIAGNOSIS — L659 Nonscarring hair loss, unspecified: Secondary | ICD-10-CM | POA: Diagnosis not present

## 2022-07-15 ENCOUNTER — Other Ambulatory Visit: Payer: Self-pay | Admitting: Physician Assistant

## 2022-07-16 DIAGNOSIS — E7800 Pure hypercholesterolemia, unspecified: Secondary | ICD-10-CM | POA: Diagnosis not present

## 2022-07-16 DIAGNOSIS — R790 Abnormal level of blood mineral: Secondary | ICD-10-CM | POA: Diagnosis not present

## 2022-07-16 DIAGNOSIS — R779 Abnormality of plasma protein, unspecified: Secondary | ICD-10-CM | POA: Diagnosis not present

## 2022-07-16 DIAGNOSIS — I2510 Atherosclerotic heart disease of native coronary artery without angina pectoris: Secondary | ICD-10-CM | POA: Diagnosis not present

## 2022-10-12 DIAGNOSIS — L649 Androgenic alopecia, unspecified: Secondary | ICD-10-CM | POA: Diagnosis not present

## 2022-10-12 DIAGNOSIS — L659 Nonscarring hair loss, unspecified: Secondary | ICD-10-CM | POA: Diagnosis not present

## 2022-10-12 DIAGNOSIS — L650 Telogen effluvium: Secondary | ICD-10-CM | POA: Diagnosis not present

## 2022-10-29 ENCOUNTER — Other Ambulatory Visit: Payer: Self-pay | Admitting: Family Medicine

## 2022-10-29 DIAGNOSIS — Z1231 Encounter for screening mammogram for malignant neoplasm of breast: Secondary | ICD-10-CM

## 2022-11-03 ENCOUNTER — Other Ambulatory Visit: Payer: Self-pay | Admitting: Cardiology

## 2022-12-15 ENCOUNTER — Ambulatory Visit
Admission: RE | Admit: 2022-12-15 | Discharge: 2022-12-15 | Disposition: A | Discharge status: Home / Self Care | Payer: Medicare Other | Source: Ambulatory Visit | Attending: Family Medicine | Admitting: Family Medicine

## 2022-12-15 DIAGNOSIS — Z1231 Encounter for screening mammogram for malignant neoplasm of breast: Secondary | ICD-10-CM | POA: Diagnosis not present

## 2023-01-26 DIAGNOSIS — H35372 Puckering of macula, left eye: Secondary | ICD-10-CM | POA: Diagnosis not present

## 2023-01-26 DIAGNOSIS — H52223 Regular astigmatism, bilateral: Secondary | ICD-10-CM | POA: Diagnosis not present

## 2023-01-26 DIAGNOSIS — H43813 Vitreous degeneration, bilateral: Secondary | ICD-10-CM | POA: Diagnosis not present

## 2023-01-26 DIAGNOSIS — H2513 Age-related nuclear cataract, bilateral: Secondary | ICD-10-CM | POA: Diagnosis not present

## 2023-01-26 DIAGNOSIS — H5203 Hypermetropia, bilateral: Secondary | ICD-10-CM | POA: Diagnosis not present

## 2023-01-26 DIAGNOSIS — H524 Presbyopia: Secondary | ICD-10-CM | POA: Diagnosis not present

## 2023-01-27 DIAGNOSIS — Z0000 Encounter for general adult medical examination without abnormal findings: Secondary | ICD-10-CM | POA: Diagnosis not present

## 2023-01-27 DIAGNOSIS — M19049 Primary osteoarthritis, unspecified hand: Secondary | ICD-10-CM | POA: Diagnosis not present

## 2023-01-27 DIAGNOSIS — I2510 Atherosclerotic heart disease of native coronary artery without angina pectoris: Secondary | ICD-10-CM | POA: Diagnosis not present

## 2023-01-27 DIAGNOSIS — Z9181 History of falling: Secondary | ICD-10-CM | POA: Diagnosis not present

## 2023-01-27 DIAGNOSIS — E7800 Pure hypercholesterolemia, unspecified: Secondary | ICD-10-CM | POA: Diagnosis not present

## 2023-03-03 DIAGNOSIS — J358 Other chronic diseases of tonsils and adenoids: Secondary | ICD-10-CM | POA: Diagnosis not present

## 2023-04-12 DIAGNOSIS — L649 Androgenic alopecia, unspecified: Secondary | ICD-10-CM | POA: Diagnosis not present

## 2023-04-14 ENCOUNTER — Telehealth: Payer: Self-pay | Admitting: Cardiology

## 2023-04-14 NOTE — Telephone Encounter (Signed)
Chest pressure and on for 2 -3 weeks. Starts center of chest to base of throat and "feels like a tennis ball".  Knot feeling and pressure. She states heart attack 77 years and has had this feeling maybe once., until recently.  Has taken nitroglycerin and it has resolved the pain.  Last episode was today and she took nitroglycerin which relieved.  She states she almost called 911. She has had 4 episodes in the last 2-3 weeks, today being the worst.  She walked a ile this morning with no issues and then the pain/episode today happened about noon.  She is fine now after nitroglycerin.  No N & V, no SOB, No sweating.  Appt made for DOD for this Friday.  Ed precautions given ad she states understanding

## 2023-04-14 NOTE — Telephone Encounter (Signed)
Pt c/o of Chest Pain: STAT if CP now or developed within 24 hours  1. Are you having CP right now? No  2. Are you experiencing any other symptoms (ex. SOB, nausea, vomiting, sweating)? No  3. How long have you been experiencing CP? 2-3 weeks   4. Is your CP continuous or coming and going? Coming and going   5. Have you taken Nitroglycerin? Yes  ?

## 2023-04-15 NOTE — H&P (View-Only) (Signed)
Cardiology Office Note:    Date:  04/16/2023   ID:  Kaitlyn Lozano, DOB Jun 29, 1951, MRN [id number]  PCP:  [name], MD   [facility] Providers Cardiologist:  Simrit Gohlke [place], MD     Referring MD: [name], MD   Chief Complaint  Patient presents with   Chest Pain   Coronary Artery Disease     History of Present Illness:    Kaitlyn Lozano is a 72 y.o. female with a hx of CAD, HLD and GERD.  She is seen today for evaluation of chest pain. Patient was admitted in December 2016 for new onset chest pain.  Cardiac cath on 07/23/2015 showed 95% mid LAD lesion treated with DES (2.5 x 16 mm Synergy), residual 55 to 60% disease in OM1 and OM 2.  She had a stress echo in November 2018 which was normal. Patient presented to [facility] on 08/11/2018 with chest pain.  The symptom was alleviated by nitroglycerin.  Chest x-ray was normal.  EKG showed no acute changes.  D-dimer was negative.  Patient was offered and encouraged admission for continued monitoring and a stress test versus cardiac catheterization however she declined admission.  Troponin was negative x2.  CK-MB and CK were normal as well.  Hemoglobin was normal.  Renal function and electrolyte also normal.  BUN to creatinine ratio was 27.1.  Stress echo in February 2020 revealed EF 55 to 60%, grade 1 DD, moderate aortic valve regurgitation, no echocardiographic finding for stress-induced ischemia. On my review I felt that her aortic regurgitation is mild to moderate.  On follow up today she reports episodes of chest pain over the past 3 weeks. Notes mid sternal chest pain radiating to her throat "feels like a tennis ball in her throat". Has had to use sl Ntg 5 times in last 2 weeks. On Wednesday she had her worst episode and had to take 2 sl Ntg. Prior to this she was exercising regularly swimming and doing water aerobics 3 days a week and walking the other days. She does note significant family stressors recently.   Past Medical  History:  Diagnosis Date   Arthritis    OA   Bronchitis    eosinophilic   CAD (coronary artery disease) 07/21/2015   a. 07/2015: 95% stenosis of LAD -->2.5 mm x 16 mm Synergy DES placed, 60% stenosis 1st Mrg, 55% stenosis 2nd Mrg, EF normal   Depression    Depression    GERD (gastroesophageal reflux disease)     Past Surgical History:  Procedure Laterality Date   CARDIAC CATHETERIZATION N/A 07/23/2015   Procedure: Left Heart Cath and Coronary Angiography;  Surgeon: [name], MD;  Location: [facility];  Service: Cardiovascular;  Laterality: N/A;   CARDIAC CATHETERIZATION N/A 07/23/2015   Procedure: Coronary Stent Intervention;  Surgeon: [name], MD;  Location: [facility] INVASIVE [facility];  Service: Cardiovascular;  Laterality: N/A;   DILATION AND CURETTAGE OF UTERUS  [phone]   dnc     HERNIA REPAIR      Current Medications: Current Meds  Medication Sig   acetaminophen (TYLENOL) 650 MG CR tablet Take 1,300 mg by mouth 2 (two) times daily.   aspirin EC 81 MG tablet Take 1 tablet (81 mg total) by mouth daily.   Calcium Citrate-Vitamin D (CITRACAL + D PO) Take 1 tablet by mouth 2 (two) times daily.   COLLAGEN-BORON-HYALURONIC ACID PO Take by mouth.   DULoxetine (CYMBALTA) 20 MG capsule Take 60  mg by mouth daily.   isosorbide mononitrate (IMDUR) 30 MG 24 hr tablet Take 1 tablet (30 mg total) by mouth daily.   metoprolol succinate (TOPROL-XL) 25 MG 24 hr tablet TAKE ONE-HALF TABLET BY MOUTH  DAILY   minoxidil (ROGAINE) 2 % external solution Apply 5 % topically at bedtime.   Multiple Vitamin (MULTIVITAMIN) tablet Take 1 tablet by mouth daily.   rosuvastatin (CRESTOR) 5 MG tablet TAKE 1 TABLET BY MOUTH DAILY   Turmeric 500 MG CAPS Take 1,000 mg by mouth daily.    vitamin B-12 (CYANOCOBALAMIN) 1000 MCG tablet Take 1,000 mcg by mouth daily.   vitamin C (ASCORBIC ACID) 500 MG tablet Take 500 mg by mouth daily.     Allergies:   Sulfa antibiotics, Atorvastatin, and  Sulfonamide derivatives   Social History   Socioeconomic History   Marital status: Married    Spouse name: Not on file   Number of children: Not on file   Years of education: Not on file   Highest education level: Not on file  Occupational History   Not on file  Tobacco Use   Smoking status: Former   Smokeless tobacco: Never  Vaping Use   Vaping status: Never Used  Substance and Sexual Activity   Alcohol use: Yes    Comment: rare   Drug use: No   Sexual activity: Not on file  Other Topics Concern   Not on file  Social History Narrative   Not on file   Social Determinants of Health   Financial Resource Strain: Patient Declined (10/12/2022)   Received from [facility], [facility]   Overall Financial Resource Strain (CARDIA)    Difficulty of Paying Living Expenses: Patient declined  Food Insecurity: Patient Declined (10/12/2022)   Received from [facility], [facility]   Hunger Vital Sign    Worried About Running Out of Food in the Last Year: Patient declined    Ran Out of Food in the Last Year: Patient declined  Transportation Needs: Patient Declined (10/12/2022)   Received from [facility], [facility]   PRAPARE - Transportation    Lack of Transportation (Medical): Patient declined    Lack of Transportation (Non-Medical): Patient declined  Physical Activity: Not on file  Stress: Not on file  Social Connections: Unknown (12/13/2021)   Received from [facility], [facility]   Social Network    Social Network: Not on file     Family History: The patient's family history includes Aortic stenosis in her father; Atrial fibrillation in her mother; Clotting disorder in her sister; Healthy in her brother and sister; Heart attack in her maternal grandfather; Heart failure in her maternal grandmother and mother; Hyperlipidemia in her father; Hypertension in her mother; Macular degeneration in her mother; Skin cancer in her mother; Stroke in her mother; Transient  ischemic attack in her mother.  ROS:   Please see the history of present illness.     All other systems reviewed and are negative.  EKGs/Labs/Other Studies Reviewed:    The following studies were reviewed today:  Echo 09/07/2018 LV EF: 55% -   60%  Study Conclusions   - Left ventricle: Systolic function was normal. The estimated    ejection fraction was in the range of 55% to 60%. Doppler    parameters are consistent with abnormal left ventricular    relaxation (grade 1 diastolic dysfunction).  - Aortic valve: There was moderate regurgitation. Regurgitation    pressure half-time: 383 ms.  - Stress ECG  conclusions: There were no stress arrhythmias or    conduction abnormalities.  - Staged echo: There was no echocardiographic evidence for    stress-induced ischemia.   Impressions:   - Negative, adequate stress test.    EKG Interpretation Date/Time:  Friday April 16 2023 11:36:58 EDT Ventricular Rate:  77 PR Interval:  134 QRS Duration:  86 QT Interval:  366 QTC Calculation: 414 R Axis:   54  Text Interpretation: Normal sinus rhythm Possible Left atrial enlargement When compared with ECG of 26-Aug-2015 14:36, No significant change since last tracing Confirmed by [place], Frank Pilger [phone]) on 04/16/2023 11:39:26 AM    Recent Labs: No results found for requested labs within last 365 days.  Recent Lipid Panel    Component Value Date/Time   CHOL 121 08/28/2021 0853   TRIG 56 08/28/2021 0853   HDL 66 08/28/2021 0853   CHOLHDL 1.8 08/28/2021 0853   CHOLHDL 1.8 07/22/2016 0940   VLDL 9 07/22/2016 0940   LDLCALC 42 08/28/2021 0853   Dated 01/14/22: cholesterol 138, triglycerides 74, HDL 69, LDL 54. CBC, CMET, TSH normal.  Dated 01/27/23: cholesterol 154, triglycerides 85, HDL 75, LDL 63. Normal CBC, CMET and TSH  Risk Assessment/Calculations:           Physical Exam:    VS:  BP 122/64   Pulse 77   Ht 5\' 4"  (1.626 m)   Wt 126 lb 12.8 oz (57.5 kg)   SpO2 99%   BMI  21.77 kg/m     Wt Readings from Last 3 Encounters:  04/16/23 126 lb 12.8 oz (57.5 kg)  06/09/22 126 lb 12.8 oz (57.5 kg)  05/28/21 126 lb 6.4 oz (57.3 kg)     GEN:  Well nourished, well developed in no acute distress HEENT: Normal NECK: No JVD; No carotid bruits LYMPHATICS: No lymphadenopathy CARDIAC: RRR, no murmurs, rubs, gallops RESPIRATORY:  Clear to auscultation without rales, wheezing or rhonchi  ABDOMEN: Soft, non-tender, non-distended MUSCULOSKELETAL:  No edema; No deformity  SKIN: Warm and dry NEUROLOGIC:  Alert and oriented x 3 PSYCHIATRIC:  Normal affect   ASSESSMENT:    1. Coronary artery disease involving native coronary artery of native heart with unstable angina pectoris (HCC)   2. Chest pressure   3. Nonrheumatic aortic valve insufficiency   4. Hyperlipidemia LDL goal <70      PLAN:    In order of problems listed above:  CAD: Previously underwent DES to mid LAD in December 2016. 50-60% bifurcation disease in the LCx. Stress Echo in 2020 was negative for ischemia. Now with progressive symptoms c/w unstable angina. Continue aspirin, beta-blocker and statin. Will add Imdur 30 mg daily. I have recommended proceeding directly to cardiac cath with possible PCI if indicated. On prior cath unable to advance catheter into the aorta and had to use femoral approach. The procedure and risks were reviewed including but not limited to death, myocardial infarction, stroke, arrythmias, bleeding, transfusion, emergency surgery, dye allergy, or renal dysfunction. The patient voices understanding and is agreeable to proceed.  Hyperlipidemia: LDL 63 on lab in June  Aortic valve insufficiency: Mild to moderate AI noted during previous stress echo in 2020.  No significant heart murmur on physical exam.  No worsening dyspnea or chest discomfort.  Asymptomatic.         Medication Adjustments/Labs and Tests Ordered: Current medicines are reviewed at length with the patient today.   Concerns regarding medicines are outlined above.  Orders Placed This Encounter  Procedures  Basic metabolic panel   CBC w/Diff/Platelet   PT and PTT   EKG 12-Lead   Meds ordered this encounter  Medications   isosorbide mononitrate (IMDUR) 30 MG 24 hr tablet    Sig: Take 1 tablet (30 mg total) by mouth daily.    Dispense:  30 tablet    Refill:  3    There are no Patient Instructions on file for this visit.   Signed, Hazim Treadway [place], MD  04/16/2023 12:15 PM    [facility]

## 2023-04-15 NOTE — Progress Notes (Deleted)
HPI: Follow-up coronary artery disease.  Patient is followed by Dr.  Cardiac catheterization December 2016 showed 95% mid LAD treated with drug-eluting stent.  Stress echocardiogram February 2020 showed normal LV function, moderate aortic insufficiency but no stress-induced wall motion abnormalities.  Patient contacted the office September 11 with complaints of chest pain and added to my schedule today.  Current Outpatient Medications  Medication Sig Dispense Refill   acetaminophen (TYLENOL) 650 MG CR tablet Take 1,300 mg by mouth 2 (two) times daily.     aspirin EC 81 MG tablet Take 1 tablet (81 mg total) by mouth daily. 90 tablet 3   Calcium Citrate-Vitamin D (CITRACAL + D PO) Take 1 tablet by mouth 2 (two) times daily.     DULoxetine (CYMBALTA) 20 MG capsule Take 40 mg by mouth 2 (two) times daily.     metoprolol succinate (TOPROL-XL) 25 MG 24 hr tablet TAKE ONE-HALF TABLET BY MOUTH  DAILY 50 tablet 2   nitroGLYCERIN (NITROSTAT) 0.4 MG SL tablet Place 1 tablet (0.4 mg total) under the tongue every 5 (five) minutes as needed for chest pain. 25 tablet 6   rosuvastatin (CRESTOR) 5 MG tablet TAKE 1 TABLET BY MOUTH DAILY 90 tablet 2   Turmeric 500 MG CAPS Take 1,000 mg by mouth daily.      vitamin B-12 (CYANOCOBALAMIN) 1000 MCG tablet Take 1,000 mcg by mouth daily.     vitamin C (ASCORBIC ACID) 500 MG tablet Take 500 mg by mouth daily.     No current facility-administered medications for this visit.     Past Medical History:  Diagnosis Date   Arthritis    OA   Bronchitis    eosinophilic   CAD (coronary artery disease) 07/21/2015   a. 07/2015: 95% stenosis of LAD -->2.5 mm x 16 mm Synergy DES placed, 60% stenosis 1st Mrg, 55% stenosis 2nd Mrg, EF normal   Depression    Depression    GERD (gastroesophageal reflux disease)     Past Surgical History:  Procedure Laterality Date   CARDIAC CATHETERIZATION N/A 07/23/2015   Procedure: Left Heart Cath and Coronary Angiography;   Surgeon: [name], MD;  Location: [facility] INVASIVE CV LAB;  Service: Cardiovascular;  Laterality: N/A;   CARDIAC CATHETERIZATION N/A 07/23/2015   Procedure: Coronary Stent Intervention;  Surgeon: [name], MD;  Location: [facility] INVASIVE CV LAB;  Service: Cardiovascular;  Laterality: N/A;   DILATION AND CURETTAGE OF UTERUS  [phone]   dnc     HERNIA REPAIR      Social History   Socioeconomic History   Marital status: Married    Spouse name: Not on file   Number of children: Not on file   Years of education: Not on file   Highest education level: Not on file  Occupational History   Not on file  Tobacco Use   Smoking status: Former   Smokeless tobacco: Never  Vaping Use   Vaping status: Never Used  Substance and Sexual Activity   Alcohol use: Yes    Comment: rare   Drug use: No   Sexual activity: Not on file  Other Topics Concern   Not on file  Social History Narrative   Not on file   Social Determinants of Health   Financial Resource Strain: Patient Declined (10/12/2022)   Received from [facility], [facility]   Overall Financial Resource Strain (CARDIA)    Difficulty of Paying Living Expenses: Patient declined  Food Insecurity: Patient  Declined (10/12/2022)   Received from [facility], [facility]   Hunger Vital Sign    Worried About Running Out of Food in the Last Year: Patient declined    Ran Out of Food in the Last Year: Patient declined  Transportation Needs: Patient Declined (10/12/2022)   Received from [facility], [facility]   [facility] - Transportation    Lack of Transportation (Medical): Patient declined    Lack of Transportation (Non-Medical): Patient declined  Physical Activity: Not on file  Stress: Not on file  Social Connections: Unknown (12/13/2021)   Received from [facility], [facility]   Social Network    Social Network: Not on file  Intimate Partner Violence: Unknown (11/06/2021)   Received from [facility], [facility]    HITS    Physically Hurt: Not on file    Insult or Talk Down To: Not on file    Threaten Physical Harm: Not on file    Scream or Curse: Not on file    Family History  Problem Relation Age of Onset   Aortic stenosis Father        Artificial Valve   Hyperlipidemia Father    Atrial fibrillation Mother    Transient ischemic attack Mother    Hypertension Mother    Stroke Mother    Heart failure Mother        PPM   Macular degeneration Mother    Skin cancer Mother    Heart failure Maternal Grandmother    Heart attack Maternal Grandfather    Clotting disorder Sister    Healthy Sister    Healthy Brother     ROS: no fevers or chills, productive cough, hemoptysis, dysphasia, odynophagia, melena, hematochezia, dysuria, hematuria, rash, seizure activity, orthopnea, PND, pedal edema, claudication. Remaining systems are negative.  Physical Exam: Well-developed well-nourished in no acute distress.  Skin is warm and dry.  HEENT is normal.  Neck is supple.  Chest is clear to auscultation with normal expansion.  Cardiovascular exam is regular rate and rhythm.  Abdominal exam nontender or distended. No masses palpated. Extremities show no edema. neuro grossly intact  ECG- personally reviewed  A/P  1 chest pain-  2 coronary artery disease-  3 hyperlipidemia-  4 history of aortic insufficiency-we will schedule follow-up echocardiogram to reassess.  Olga Millers, MD

## 2023-04-15 NOTE — Progress Notes (Signed)
Cardiology Office Note:    Date:  04/16/2023   ID:  Kaitlyn Lozano, DOB Jun 29, 1951, MRN [id number]  PCP:  [name], MD   [facility] Providers Cardiologist:  Simrit Gohlke [place], MD     Referring MD: [name], MD   Chief Complaint  Patient presents with   Chest Pain   Coronary Artery Disease     History of Present Illness:    Kaitlyn Lozano is a 72 y.o. female with a hx of CAD, HLD and GERD.  She is seen today for evaluation of chest pain. Patient was admitted in December 2016 for new onset chest pain.  Cardiac cath on 07/23/2015 showed 95% mid LAD lesion treated with DES (2.5 x 16 mm Synergy), residual 55 to 60% disease in OM1 and OM 2.  She had a stress echo in November 2018 which was normal. Patient presented to [facility] on 08/11/2018 with chest pain.  The symptom was alleviated by nitroglycerin.  Chest x-ray was normal.  EKG showed no acute changes.  D-dimer was negative.  Patient was offered and encouraged admission for continued monitoring and a stress test versus cardiac catheterization however she declined admission.  Troponin was negative x2.  CK-MB and CK were normal as well.  Hemoglobin was normal.  Renal function and electrolyte also normal.  BUN to creatinine ratio was 27.1.  Stress echo in February 2020 revealed EF 55 to 60%, grade 1 DD, moderate aortic valve regurgitation, no echocardiographic finding for stress-induced ischemia. On my review I felt that her aortic regurgitation is mild to moderate.  On follow up today she reports episodes of chest pain over the past 3 weeks. Notes mid sternal chest pain radiating to her throat "feels like a tennis ball in her throat". Has had to use sl Ntg 5 times in last 2 weeks. On Wednesday she had her worst episode and had to take 2 sl Ntg. Prior to this she was exercising regularly swimming and doing water aerobics 3 days a week and walking the other days. She does note significant family stressors recently.   Past Medical  History:  Diagnosis Date   Arthritis    OA   Bronchitis    eosinophilic   CAD (coronary artery disease) 07/21/2015   a. 07/2015: 95% stenosis of LAD -->2.5 mm x 16 mm Synergy DES placed, 60% stenosis 1st Mrg, 55% stenosis 2nd Mrg, EF normal   Depression    Depression    GERD (gastroesophageal reflux disease)     Past Surgical History:  Procedure Laterality Date   CARDIAC CATHETERIZATION N/A 07/23/2015   Procedure: Left Heart Cath and Coronary Angiography;  Surgeon: [name], MD;  Location: [facility] INVASIVE CV LAB;  Service: Cardiovascular;  Laterality: N/A;   CARDIAC CATHETERIZATION N/A 07/23/2015   Procedure: Coronary Stent Intervention;  Surgeon: [name], MD;  Location: [facility] INVASIVE CV LAB;  Service: Cardiovascular;  Laterality: N/A;   DILATION AND CURETTAGE OF UTERUS  [phone]   dnc     HERNIA REPAIR      Current Medications: Current Meds  Medication Sig   acetaminophen (TYLENOL) 650 MG CR tablet Take 1,300 mg by mouth 2 (two) times daily.   aspirin EC 81 MG tablet Take 1 tablet (81 mg total) by mouth daily.   Calcium Citrate-Vitamin D (CITRACAL + D PO) Take 1 tablet by mouth 2 (two) times daily.   COLLAGEN-BORON-HYALURONIC ACID PO Take by mouth.   DULoxetine (CYMBALTA) 20 MG capsule Take 60  mg by mouth daily.   isosorbide mononitrate (IMDUR) 30 MG 24 hr tablet Take 1 tablet (30 mg total) by mouth daily.   metoprolol succinate (TOPROL-XL) 25 MG 24 hr tablet TAKE ONE-HALF TABLET BY MOUTH  DAILY   minoxidil (ROGAINE) 2 % external solution Apply 5 % topically at bedtime.   Multiple Vitamin (MULTIVITAMIN) tablet Take 1 tablet by mouth daily.   rosuvastatin (CRESTOR) 5 MG tablet TAKE 1 TABLET BY MOUTH DAILY   Turmeric 500 MG CAPS Take 1,000 mg by mouth daily.    vitamin B-12 (CYANOCOBALAMIN) 1000 MCG tablet Take 1,000 mcg by mouth daily.   vitamin C (ASCORBIC ACID) 500 MG tablet Take 500 mg by mouth daily.     Allergies:   Sulfa antibiotics, Atorvastatin, and  Sulfonamide derivatives   Social History   Socioeconomic History   Marital status: Married    Spouse name: Not on file   Number of children: Not on file   Years of education: Not on file   Highest education level: Not on file  Occupational History   Not on file  Tobacco Use   Smoking status: Former   Smokeless tobacco: Never  Vaping Use   Vaping status: Never Used  Substance and Sexual Activity   Alcohol use: Yes    Comment: rare   Drug use: No   Sexual activity: Not on file  Other Topics Concern   Not on file  Social History Narrative   Not on file   Social Determinants of Health   Financial Resource Strain: Patient Declined (10/12/2022)   Received [facility], [facility]   Overall Financial Resource Strain (CARDIA)    Difficulty of Paying Living Expenses: Patient declined  Food Insecurity: Patient Declined (10/12/2022)   Received from [facility], [facility]   Hunger Vital Sign    Worried About Running Out of Food in the Last Year: Patient declined    Ran Out of Food in the Last Year: Patient declined  Transportation Needs: Patient Declined (10/12/2022)   Received from [facility], [facility]   PRAPARE - Transportation    Lack of Transportation (Medical): Patient declined    Lack of Transportation (Non-Medical): Patient declined  Physical Activity: Not on file  Stress: Not on file  Social Connections: Unknown (12/13/2021)   Received from [facility], [facility]   Social Network    Social Network: Not on file     Family History: The patient's family history includes Aortic stenosis in her father; Atrial fibrillation in her mother; Clotting disorder in her sister; Healthy in her brother and sister; Heart attack in her maternal grandfather; Heart failure in her maternal grandmother and mother; Hyperlipidemia in her father; Hypertension in her mother; Macular degeneration in her mother; Skin cancer in her mother; Stroke in her mother; Transient  ischemic attack in her mother.  ROS:   Please see the history of present illness.     All other systems reviewed and are negative.  EKGs/Labs/Other Studies Reviewed:    The following studies were reviewed today:  Echo 09/07/2018 LV EF: 55% -   60%  Study Conclusions   - Left ventricle: Systolic function was normal. The estimated    ejection fraction was in the range of 55% to 60%. Doppler    parameters are consistent with abnormal left ventricular    relaxation (grade 1 diastolic dysfunction).  - Aortic valve: There was moderate regurgitation. Regurgitation    pressure half-time: 383 ms.  - Stress ECG  conclusions: There were no stress arrhythmias or    conduction abnormalities.  - Staged echo: There was no echocardiographic evidence for    stress-induced ischemia.   Impressions:   - Negative, adequate stress test.    EKG Interpretation Date/Time:  Friday April 16 2023 11:36:58 EDT Ventricular Rate:  77 PR Interval:  134 QRS Duration:  86 QT Interval:  366 QTC Calculation: 414 R Axis:   54  Text Interpretation: Normal sinus rhythm Possible Left atrial enlargement When compared with ECG of 26-Aug-2015 14:36, No significant change since last tracing Confirmed by [place], Frank Pilger [phone]) on 04/16/2023 11:39:26 AM    Recent Labs: No results found for requested labs within last 365 days.  Recent Lipid Panel    Component Value Date/Time   CHOL 121 08/28/2021 0853   TRIG 56 08/28/2021 0853   HDL 66 08/28/2021 0853   CHOLHDL 1.8 08/28/2021 0853   CHOLHDL 1.8 07/22/2016 0940   VLDL 9 07/22/2016 0940   LDLCALC 42 08/28/2021 0853   Dated 01/14/22: cholesterol 138, triglycerides 74, HDL 69, LDL 54. CBC, CMET, TSH normal.  Dated 01/27/23: cholesterol 154, triglycerides 85, HDL 75, LDL 63. Normal CBC, CMET and TSH  Risk Assessment/Calculations:           Physical Exam:    VS:  BP 122/64   Pulse 77   Ht 5\' 4"  (1.626 m)   Wt 126 lb 12.8 oz (57.5 kg)   SpO2 99%   BMI  21.77 kg/m     Wt Readings from Last 3 Encounters:  04/16/23 126 lb 12.8 oz (57.5 kg)  06/09/22 126 lb 12.8 oz (57.5 kg)  05/28/21 126 lb 6.4 oz (57.3 kg)     GEN:  Well nourished, well developed in no acute distress HEENT: Normal NECK: No JVD; No carotid bruits LYMPHATICS: No lymphadenopathy CARDIAC: RRR, no murmurs, rubs, gallops RESPIRATORY:  Clear to auscultation without rales, wheezing or rhonchi  ABDOMEN: Soft, non-tender, non-distended MUSCULOSKELETAL:  No edema; No deformity  SKIN: Warm and dry NEUROLOGIC:  Alert and oriented x 3 PSYCHIATRIC:  Normal affect   ASSESSMENT:    1. Coronary artery disease involving native coronary artery of native heart with unstable angina pectoris (HCC)   2. Chest pressure   3. Nonrheumatic aortic valve insufficiency   4. Hyperlipidemia LDL goal <70      PLAN:    In order of problems listed above:  CAD: Previously underwent DES to mid LAD in December 2016. 50-60% bifurcation disease in the LCx. Stress Echo in 2020 was negative for ischemia. Now with progressive symptoms c/w unstable angina. Continue aspirin, beta-blocker and statin. Will add Imdur 30 mg daily. I have recommended proceeding directly to cardiac cath with possible PCI if indicated. On prior cath unable to advance catheter into the aorta and had to use femoral approach. The procedure and risks were reviewed including but not limited to death, myocardial infarction, stroke, arrythmias, bleeding, transfusion, emergency surgery, dye allergy, or renal dysfunction. The patient voices understanding and is agreeable to proceed.  Hyperlipidemia: LDL 63 on lab in June  Aortic valve insufficiency: Mild to moderate AI noted during previous stress echo in 2020.  No significant heart murmur on physical exam.  No worsening dyspnea or chest discomfort.  Asymptomatic.         Medication Adjustments/Labs and Tests Ordered: Current medicines are reviewed at length with the patient today.   Concerns regarding medicines are outlined above.  Orders Placed This Encounter  Procedures  Basic metabolic panel   CBC w/Diff/Platelet   PT and PTT   EKG 12-Lead   Meds ordered this encounter  Medications   isosorbide mononitrate (IMDUR) 30 MG 24 hr tablet    Sig: Take 1 tablet (30 mg total) by mouth daily.    Dispense:  30 tablet    Refill:  3    There are no Patient Instructions on file for this visit.   Signed, Hazim Treadway [place], MD  04/16/2023 12:15 PM    [facility]

## 2023-04-16 ENCOUNTER — Ambulatory Visit: Payer: Medicare Other | Attending: Cardiology | Admitting: Cardiology

## 2023-04-16 ENCOUNTER — Ambulatory Visit: Payer: Medicare Other | Admitting: Adult Health

## 2023-04-16 ENCOUNTER — Ambulatory Visit: Payer: Medicare Other | Admitting: Cardiology

## 2023-04-16 ENCOUNTER — Encounter: Payer: Self-pay | Admitting: Cardiology

## 2023-04-16 ENCOUNTER — Other Ambulatory Visit: Payer: Self-pay | Admitting: Cardiology

## 2023-04-16 VITALS — BP 122/64 | HR 77 | Ht 64.0 in | Wt 126.8 lb

## 2023-04-16 DIAGNOSIS — I200 Unstable angina: Secondary | ICD-10-CM

## 2023-04-16 DIAGNOSIS — E785 Hyperlipidemia, unspecified: Secondary | ICD-10-CM | POA: Diagnosis not present

## 2023-04-16 DIAGNOSIS — R0789 Other chest pain: Secondary | ICD-10-CM

## 2023-04-16 DIAGNOSIS — I351 Nonrheumatic aortic (valve) insufficiency: Secondary | ICD-10-CM

## 2023-04-16 DIAGNOSIS — I25110 Atherosclerotic heart disease of native coronary artery with unstable angina pectoris: Secondary | ICD-10-CM | POA: Diagnosis not present

## 2023-04-16 MED ORDER — ISOSORBIDE MONONITRATE ER 30 MG PO TB24: 30.0000 mg | ORAL_TABLET | Freq: Every day | ORAL | 3 refills | Status: DC

## 2023-04-16 NOTE — Patient Instructions (Signed)
Medication Instructions:  Start Isosorbide 30 mg daily Continue all other medications *If you need a refill on your cardiac medications before your next appointment, please call your pharmacy*   Lab Work: Bmet,cbc,pt to be done Thurs 9/19   Testing/Procedures: Cardiac Cath scheduled at [facility] Thurs 9/26 Arrive at 5:30 am  Follow instructions below   Follow-Up: At [facility], you and your health needs are our priority.  As part of our continuing mission to provide you with exceptional heart care, we have created designated Provider Care Teams.  These Care Teams include your primary Cardiologist (physician) and Advanced Practice Providers (APPs -  Physician Assistants and Nurse Practitioners) who all work together to provide you with the care you need, when you need it.  We recommend signing up for the patient portal called "MyChart".  Sign up information is provided on this After Visit Summary.  MyChart is used to connect with patients for Virtual Visits (Telemedicine).  Patients are able to view lab/test results, encounter notes, upcoming appointments, etc.  Non-urgent messages can be sent to your provider as well.   To learn more about what you can do with MyChart, go to [email or web address].    Your next appointment:  After Cath    Provider:  Dr.Jordan    [facility] A DEPT OF [place]. [facility] AT [facility] [address] [place] [address] Dept: [phone] Loc: [phone]  Kaitlyn Lozano  04/16/2023  You are scheduled for a Cardiac Catheterization on Thursday, September 26 with Dr. Peter [place].  1. Please arrive at the [facility] (Main Entrance A) at [facility]: [address] [place], [place] [address] at 5:30 AM (This time is 2 hour(s) before your procedure to ensure your preparation). Free valet parking service is available. You will check in at ADMITTING. The support  person will be asked to wait in the waiting room.  It is OK to have someone drop you off and come back when you are ready to be discharged.    Special note: Every effort is made to have your procedure done on time. Please understand that emergencies sometimes delay scheduled procedures.  2. Diet: Do not eat solid foods after midnight.  The patient may have clear liquids until 5am upon the day of the procedure.  3. Labs: You will need to have blood drawn on  Thurs 9/19 at Dr.Jordan's office .You do not need to be fasting.  4. Medication instructions in preparation for your procedure:        On the morning of your procedure, take your Aspirin 81 mg and any morning medicines NOT listed above.  You may use sips of water.  5. Plan to go home the same day, you will only stay overnight if medically necessary. 6. Bring a current list of your medications and current insurance cards. 7. You MUST have a responsible person to drive you home. 8. Someone MUST be with you the first 24 hours after you arrive home or your discharge will be delayed. 9. Please wear clothes that are easy to get on and off and wear slip-on shoes.  Thank you for allowing [place] to care for you!   -- [facility] [place] [facility]

## 2023-04-22 DIAGNOSIS — E785 Hyperlipidemia, unspecified: Secondary | ICD-10-CM | POA: Diagnosis not present

## 2023-04-22 DIAGNOSIS — I25110 Atherosclerotic heart disease of native coronary artery with unstable angina pectoris: Secondary | ICD-10-CM | POA: Diagnosis not present

## 2023-04-22 DIAGNOSIS — R0789 Other chest pain: Secondary | ICD-10-CM | POA: Diagnosis not present

## 2023-04-22 DIAGNOSIS — I351 Nonrheumatic aortic (valve) insufficiency: Secondary | ICD-10-CM | POA: Diagnosis not present

## 2023-04-23 ENCOUNTER — Telehealth: Payer: Self-pay | Admitting: Cardiology

## 2023-04-23 LAB — CBC WITH DIFFERENTIAL/PLATELET
Basophils Absolute: 0 10*3/uL (ref 0.0–0.2)
Basos: 0 %
EOS (ABSOLUTE): 0.1 10*3/uL (ref 0.0–0.4)
Eos: 2 %
Hematocrit: 38 % (ref 34.0–46.6)
Hemoglobin: 12.7 g/dL (ref 11.1–15.9)
Immature Grans (Abs): 0 10*3/uL (ref 0.0–0.1)
Immature Granulocytes: 0 %
Lymphocytes Absolute: 1.5 10*3/uL (ref 0.7–3.1)
Lymphs: 24 %
MCH: 32.6 pg (ref 26.6–33.0)
MCHC: 33.4 g/dL (ref 31.5–35.7)
MCV: 98 fL — ABNORMAL HIGH (ref 79–97)
Monocytes Absolute: 0.3 10*3/uL (ref 0.1–0.9)
Monocytes: 5 %
Neutrophils Absolute: 4.4 10*3/uL (ref 1.4–7.0)
Neutrophils: 69 %
Platelets: 277 10*3/uL (ref 150–450)
RBC: 3.89 x10E6/uL (ref 3.77–5.28)
RDW: 12.4 % (ref 11.7–15.4)
WBC: 6.3 10*3/uL (ref 3.4–10.8)

## 2023-04-23 LAB — BASIC METABOLIC PANEL
BUN/Creatinine Ratio: 27 (ref 12–28)
BUN: 19 mg/dL (ref 8–27)
CO2: 25 mmol/L (ref 20–29)
Calcium: 9.5 mg/dL (ref 8.7–10.3)
Chloride: 105 mmol/L (ref 96–106)
Creatinine, Ser: 0.7 mg/dL (ref 0.57–1.00)
Glucose: 74 mg/dL (ref 70–99)
Potassium: 4.5 mmol/L (ref 3.5–5.2)
Sodium: 147 mmol/L — ABNORMAL HIGH (ref 134–144)
eGFR: 91 mL/min/{1.73_m2} (ref >59)

## 2023-04-23 LAB — PT AND PTT
INR: 1 (ref 0.9–1.2)
Prothrombin Time: 11.4 s (ref 9.1–12.0)
aPTT: 28 s (ref 24–33)

## 2023-04-23 NOTE — Telephone Encounter (Signed)
Patient is returning call to discuss lab results.

## 2023-04-23 NOTE — Telephone Encounter (Signed)
Call to patient and she did not listen to the VM left.  Gave results and advised OK for the heart cath,.  She states understanding

## 2023-04-27 ENCOUNTER — Telehealth: Payer: Self-pay | Admitting: *Deleted

## 2023-04-27 NOTE — Telephone Encounter (Signed)
Cardiac Catheterization scheduled at [facility] [id number] April 29, 2023 7:30 AM Arrival time [facility] Main Entrance A at: 5:30 AM  Nothing to eat after midnight prior to procedure, clear liquids until 5 AM day of procedure.  Medication instructions: -Usual morning medications can be taken with sips of water including aspirin 81 mg.  Plan to go home the same day, you will only stay overnight if medically necessary.  You must have responsible adult to drive you home.  Someone must be with you the first 24 hours after you arrive home.  Reviewed procedure instructions with patient.

## 2023-04-29 ENCOUNTER — Other Ambulatory Visit: Payer: Self-pay

## 2023-04-29 ENCOUNTER — Encounter (HOSPITAL_COMMUNITY)
Admission: RE | Disposition: A | Discharge status: Home / Self Care | Payer: Self-pay | Source: Ambulatory Visit | Attending: Cardiology

## 2023-04-29 ENCOUNTER — Ambulatory Visit (HOSPITAL_COMMUNITY)
Admission: RE | Admit: 2023-04-29 | Discharge: 2023-04-29 | Disposition: A | Discharge status: Home / Self Care | Payer: Medicare Other | Source: Ambulatory Visit | Attending: Cardiology | Admitting: Cardiology

## 2023-04-29 DIAGNOSIS — Z79899 Other long term (current) drug therapy: Secondary | ICD-10-CM | POA: Insufficient documentation

## 2023-04-29 DIAGNOSIS — E785 Hyperlipidemia, unspecified: Secondary | ICD-10-CM | POA: Insufficient documentation

## 2023-04-29 DIAGNOSIS — Z955 Presence of coronary angioplasty implant and graft: Secondary | ICD-10-CM | POA: Insufficient documentation

## 2023-04-29 DIAGNOSIS — Z7982 Long term (current) use of aspirin: Secondary | ICD-10-CM | POA: Diagnosis not present

## 2023-04-29 DIAGNOSIS — I25110 Atherosclerotic heart disease of native coronary artery with unstable angina pectoris: Secondary | ICD-10-CM | POA: Insufficient documentation

## 2023-04-29 DIAGNOSIS — I25119 Atherosclerotic heart disease of native coronary artery with unspecified angina pectoris: Secondary | ICD-10-CM | POA: Diagnosis not present

## 2023-04-29 DIAGNOSIS — I351 Nonrheumatic aortic (valve) insufficiency: Secondary | ICD-10-CM | POA: Insufficient documentation

## 2023-04-29 DIAGNOSIS — I200 Unstable angina: Secondary | ICD-10-CM

## 2023-04-29 DIAGNOSIS — K219 Gastro-esophageal reflux disease without esophagitis: Secondary | ICD-10-CM | POA: Diagnosis not present

## 2023-04-29 HISTORY — PX: LEFT HEART CATH AND CORONARY ANGIOGRAPHY: CATH118249

## 2023-04-29 SURGERY — LEFT HEART CATH AND CORONARY ANGIOGRAPHY
Anesthesia: LOCAL

## 2023-04-29 MED ORDER — SODIUM CHLORIDE 0.9% FLUSH: 3.0000 mL | INTRAVENOUS | Status: DC | PRN

## 2023-04-29 MED ORDER — MIDAZOLAM HCL 2 MG/2ML IJ SOLN
INTRAMUSCULAR | Status: DC | PRN
  Administered 2023-04-29: 1 mg via INTRAVENOUS

## 2023-04-29 MED ORDER — IOHEXOL 350 MG/ML SOLN
INTRAVENOUS | Status: DC | PRN
  Administered 2023-04-29: 80 mL

## 2023-04-29 MED ORDER — FENTANYL CITRATE (PF) 100 MCG/2ML IJ SOLN
INTRAMUSCULAR | Status: DC | PRN
  Administered 2023-04-29: 25 ug via INTRAVENOUS

## 2023-04-29 MED ORDER — SODIUM CHLORIDE 0.9 % IV SOLN: 250.0000 mL | INTRAVENOUS | Status: DC | PRN

## 2023-04-29 MED ORDER — ACETAMINOPHEN 325 MG PO TABS: 650.0000 mg | ORAL_TABLET | ORAL | Status: DC | PRN

## 2023-04-29 MED ORDER — ASPIRIN 81 MG PO CHEW: 81.0000 mg | CHEWABLE_TABLET | ORAL | Status: DC

## 2023-04-29 MED ORDER — SODIUM CHLORIDE 0.9 % WEIGHT BASED INFUSION: 1.0000 mL/kg/h | INTRAVENOUS | Status: DC

## 2023-04-29 MED ORDER — MIDAZOLAM HCL 2 MG/2ML IJ SOLN
INTRAMUSCULAR | Status: AC
  Filled 2023-04-29: qty 2

## 2023-04-29 MED ORDER — FENTANYL CITRATE (PF) 100 MCG/2ML IJ SOLN
INTRAMUSCULAR | Status: AC
  Filled 2023-04-29: qty 2

## 2023-04-29 MED ORDER — ONDANSETRON HCL 4 MG/2ML IJ SOLN: 4.0000 mg | Freq: Four times a day (QID) | INTRAMUSCULAR | Status: DC | PRN

## 2023-04-29 MED ORDER — SODIUM CHLORIDE 0.9 % WEIGHT BASED INFUSION
3.0000 mL/kg/h | INTRAVENOUS | Status: AC
  Administered 2023-04-29: 3 mL/kg/h via INTRAVENOUS

## 2023-04-29 MED ORDER — SODIUM CHLORIDE 0.9% FLUSH: 3.0000 mL | Freq: Two times a day (BID) | INTRAVENOUS | Status: DC

## 2023-04-29 MED ORDER — HEPARIN (PORCINE) IN NACL 1000-0.9 UT/500ML-% IV SOLN
INTRAVENOUS | Status: DC | PRN
  Administered 2023-04-29 (×2): 500 mL

## 2023-04-29 MED ORDER — LIDOCAINE HCL (PF) 1 % IJ SOLN
INTRAMUSCULAR | Status: DC | PRN
  Administered 2023-04-29: 10 mL

## 2023-04-29 SURGICAL SUPPLY — 10 items
CATH INFINITI 5FR MULTPACK ANG (CATHETERS) IMPLANT
CLOSURE MYNX CONTROL 5F (Vascular Products) IMPLANT
ELECT DEFIB PAD ADLT CADENCE (PAD) IMPLANT
KIT MICROPUNCTURE NIT STIFF (SHEATH) IMPLANT
KIT SYRINGE INJ CVI SPIKEX1 (MISCELLANEOUS) IMPLANT
PACK CARDIAC CATHETERIZATION (CUSTOM PROCEDURE TRAY) ×1 IMPLANT
SET ATX-X65L (MISCELLANEOUS) IMPLANT
SHEATH PINNACLE 5F 10CM (SHEATH) IMPLANT
SHEATH PROBE COVER 6X72 (BAG) IMPLANT
WIRE EMERALD 3MM-J .035X150CM (WIRE) IMPLANT

## 2023-04-29 NOTE — Progress Notes (Signed)
Pt ambulated to and from bathroom to void with no signs of oozing from right groin site

## 2023-04-29 NOTE — Interval H&P Note (Signed)
History and Physical Interval NotCath Lab Visit (complete for each Cath Lab visit)  Clinical Evaluation Leading to the Procedure:   ACS: No.  Non-ACS:    Anginal Classification: CCS III  Anti-ischemic medical therapy: Maximal Therapy (2 or more classes of medications)  Non-Invasive Test Results: No non-invasive testing performed  Prior CABG: No previous CABG      e:  04/29/2023 7:33 AM  Kaitlyn Lozano  has presented today for surgery, with the diagnosis of chest pain.  The various methods of treatment have been discussed with the patient and family. After consideration of risks, benefits and other options for treatment, the patient has consented to  Procedure(s): LEFT HEART CATH AND CORONARY ANGIOGRAPHY (N/A) as a surgical intervention.  The patient's history has been reviewed, patient examined, no change in status, stable for surgery.  I have reviewed the patient's chart and labs.  Questions were answered to the patient's satisfaction.     Theron Arista [facility] 04/29/2023 7:33 AM

## 2023-04-30 ENCOUNTER — Encounter (HOSPITAL_COMMUNITY): Payer: Self-pay | Admitting: Cardiology

## 2023-04-30 ENCOUNTER — Telehealth: Payer: Self-pay | Admitting: Cardiology

## 2023-04-30 NOTE — Telephone Encounter (Signed)
Pt c/o medication issue:  1. Name of Medication:   isosorbide mononitrate (IMDUR) 30 MG 24 hr tablet    2. How are you currently taking this medication (dosage and times per day)?  Take 1 tablet (30 mg total) by mouth daily.       3. Are you having a reaction (difficulty breathing--STAT)? No  4. What is your medication issue? Pt is requesting a callback regarding her wanting to know if she should still be taking this medication or not since nothing was found. Please advise

## 2023-04-30 NOTE — Telephone Encounter (Signed)
Called and spoke with pt she wold like to know if she should be taking the Isosorbide as she was rx'd this at her last appointment and had a negative CATH yesterday. She would like to know if she should stop the isosorbide. Pt states that she is not having any sx.  Informed pt to continue the isosorbide until her appointment and call if any cardiac sx. Informed pt to make a log of her blood pressures a few times a week until her appt sine she does have a BP cuff so we can review at her appt. Verbalized understanding. She will arrive early for upcoming appt.

## 2023-05-03 ENCOUNTER — Other Ambulatory Visit: Payer: Self-pay | Admitting: Physician Assistant

## 2023-05-03 ENCOUNTER — Other Ambulatory Visit: Payer: Self-pay | Admitting: Cardiology

## 2023-05-14 NOTE — Progress Notes (Signed)
Cardiology Office Note:    Date:  05/19/2023   ID:  Kaitlyn Lozano, DOB Apr 06, 1951, MRN [id number]  PCP:  [name], MD   [facility] Providers Cardiologist:  Berkeley Veldman [place], MD     Referring MD: [name], MD   No chief complaint on file.    History of Present Illness:    Kaitlyn Lozano is a 72 y.o. female with a hx of CAD, HLD and GERD.  She is seen today for evaluation of chest pain. Patient was admitted in December 2016 for new onset chest pain.  Cardiac cath on 07/23/2015 showed 95% mid LAD lesion treated with DES (2.5 x 16 mm Synergy), residual 55 to 60% disease in OM1 and OM 2.  She had a stress echo in November 2018 which was normal. Patient presented to [facility] on 08/11/2018 with chest pain.  The symptom was alleviated by nitroglycerin.  Chest x-ray was normal.  EKG showed no acute changes.  D-dimer was negative.  Patient was offered and encouraged admission for continued monitoring and a stress test versus cardiac catheterization however she declined admission.  Troponin was negative x2.  CK-MB and CK were normal as well.  Hemoglobin was normal.  Renal function and electrolyte also normal.  BUN to creatinine ratio was 27.1.  Stress echo in February 2020 revealed EF 55 to 60%, grade 1 DD, moderate aortic valve regurgitation, no echocardiographic finding for stress-induced ischemia. On my review I felt that her aortic regurgitation is mild to moderate.  On follow up this fall she reports episodes of chest pain over  3 weeks.  She underwent cardiac cath showing nonobstructive CAD. She had mild AI. Since then she states she hasn't had any further chest pain.  Past Medical History:  Diagnosis Date   Arthritis    OA   Bronchitis    eosinophilic   CAD (coronary artery disease) 07/21/2015   a. 07/2015: 95% stenosis of LAD -->2.5 mm x 16 mm Synergy DES placed, 60% stenosis 1st Mrg, 55% stenosis 2nd Mrg, EF normal   Depression    Depression    GERD (gastroesophageal  reflux disease)     Past Surgical History:  Procedure Laterality Date   CARDIAC CATHETERIZATION N/A 07/23/2015   Procedure: Left Heart Cath and Coronary Angiography;  Surgeon: [name], MD;  Location: [facility] INVASIVE CV LAB;  Service: Cardiovascular;  Laterality: N/A;   CARDIAC CATHETERIZATION N/A 07/23/2015   Procedure: Coronary Stent Intervention;  Surgeon: [name], MD;  Location: [facility] INVASIVE CV LAB;  Service: Cardiovascular;  Laterality: N/A;   DILATION AND CURETTAGE OF UTERUS  [phone]   dnc     HERNIA REPAIR     LEFT HEART CATH AND CORONARY ANGIOGRAPHY N/A 04/29/2023   Procedure: LEFT HEART CATH AND CORONARY ANGIOGRAPHY;  Surgeon: [place], Jenniah Bhavsar M, MD;  Location: [facility] INVASIVE CV LAB;  Service: Cardiovascular;  Laterality: N/A;    Current Medications: Current Meds  Medication Sig   acetaminophen (TYLENOL) 650 MG CR tablet Take 1,300 mg by mouth 2 (two) times daily.   aspirin EC 81 MG tablet Take 1 tablet (81 mg total) by mouth daily.   Calcium Citrate-Vitamin D (CITRACAL + D PO) Take 600 mg by mouth 2 (two) times daily.   COLLAGEN-BORON-HYALURONIC ACID PO Take 2 tablets by mouth daily.   DULoxetine (CYMBALTA) 60 MG capsule Take 60 mg by mouth daily.   Melatonin 3 MG CAPS Take by mouth.   minoxidil (ROGAINE) 2 % external  solution Apply 5 % topically at bedtime.   Multiple Vitamin (MULTIVITAMIN) tablet Take 1 tablet by mouth daily.   Turmeric 500 MG CAPS Take 500 mg by mouth in the morning and at bedtime.   vitamin B-12 (CYANOCOBALAMIN) 1000 MCG tablet Take 1,000 mcg by mouth daily.   zinc gluconate 50 MG tablet Take 50 mg by mouth daily.   [DISCONTINUED] isosorbide mononitrate (IMDUR) 30 MG 24 hr tablet Take 1 tablet (30 mg total) by mouth daily.   [DISCONTINUED] metoprolol succinate (TOPROL-XL) 25 MG 24 hr tablet TAKE ONE-HALF TABLET BY MOUTH  DAILY   [DISCONTINUED] nitroGLYCERIN (NITROSTAT) 0.4 MG SL tablet Place 0.4 mg under the tongue every 5 (five) minutes as needed  for chest pain.   [DISCONTINUED] rosuvastatin (CRESTOR) 5 MG tablet TAKE 1 TABLET BY MOUTH DAILY     Allergies:   Sulfa antibiotics, Atorvastatin, and Sulfonamide derivatives   Social History   Socioeconomic History   Marital status: Married    Spouse name: Not on file   Number of children: Not on file   Years of education: Not on file   Highest education level: Not on file  Occupational History   Not on file  Tobacco Use   Smoking status: Former   Smokeless tobacco: Never  Vaping Use   Vaping status: Never Used  Substance and Sexual Activity   Alcohol use: Yes    Comment: rare   Drug use: No   Sexual activity: Not on file  Other Topics Concern   Not on file  Social History Narrative   Not on file   Social Determinants of Health   Financial Resource Strain: Patient Declined (10/12/2022)   Received from [facility], [facility]   Overall Financial Resource Strain (CARDIA)    Difficulty of Paying Living Expenses: Patient declined  Food Insecurity: Patient Declined (10/12/2022)   Received [facility], [facility]   Hunger Vital Sign    Worried About Running Out of Food in the Last Year: Patient declined    Ran Out of Food in the Last Year: Patient declined  Transportation Needs: Patient Declined (10/12/2022)   Received from [facility], [facility]   PRAPARE - Transportation    Lack of Transportation (Medical): Patient declined    Lack of Transportation (Non-Medical): Patient declined  Physical Activity: Not on file  Stress: Not on file  Social Connections: Unknown (12/13/2021)   Received from [facility], [facility]   Social Network    Social Network: Not on file     Family History: The patient's family history includes Aortic stenosis in her father; Atrial fibrillation in her mother; Clotting disorder in her sister; Healthy in her brother and sister; Heart attack in her maternal grandfather; Heart failure in her maternal grandmother and mother;  Hyperlipidemia in her father; Hypertension in her mother; Macular degeneration in her mother; Skin cancer in her mother; Stroke in her mother; Transient ischemic attack in her mother.  ROS:   Please see the history of present illness.     All other systems reviewed and are negative.  EKGs/Labs/Other Studies Reviewed:    The following studies were reviewed today:  Echo 09/07/2018 LV EF: 55% -   60%  Study Conclusions   - Left ventricle: Systolic function was normal. The estimated    ejection fraction was in the range of 55% to 60%. Doppler    parameters are consistent with abnormal left ventricular    relaxation (grade 1 diastolic dysfunction).  -  Aortic valve: There was moderate regurgitation. Regurgitation    pressure half-time: 383 ms.  - Stress ECG conclusions: There were no stress arrhythmias or    conduction abnormalities.  - Staged echo: There was no echocardiographic evidence for    stress-induced ischemia.   Impressions:   - Negative, adequate stress test.      Cardiac cath 04/29/23:  LEFT HEART CATH AND CORONARY ANGIOGRAPHY   Conclusion      1st Mrg lesion is 50% stenosed.   2nd Mrg lesion is 60% stenosed.   Dist RCA lesion is 30% stenosed.   Previously placed Mid LAD stent of unknown type is  widely patent.   The left ventricular systolic function is normal.   LV end diastolic pressure is normal.   The left ventricular ejection fraction is 55-65% by visual estimate.   There is trivial (1+) aortic regurgitation.   Nonobstructive CAD. Patent stent in the mid LAD. No change in OM bifurcation disease compared to 2016.  Normal LV function Normal LVEDP Mild aortic insufficiency. Mild aortic root enlargement   Plan: continue medical therapy.    Recent Labs: 04/22/2023: BUN 19; Creatinine, Ser 0.70; Hemoglobin 12.7; Platelets 277; Potassium 4.5; Sodium 147  Recent Lipid Panel    Component Value Date/Time   CHOL 121 08/28/2021 0853   TRIG 56 08/28/2021 0853    HDL 66 08/28/2021 0853   CHOLHDL 1.8 08/28/2021 0853   CHOLHDL 1.8 07/22/2016 0940   VLDL 9 07/22/2016 0940   LDLCALC 42 08/28/2021 0853   Dated 01/14/22: cholesterol 138, triglycerides 74, HDL 69, LDL 54. CBC, CMET, TSH normal.  Dated 01/27/23: cholesterol 154, triglycerides 85, HDL 75, LDL 63. Normal CBC, CMET and TSH  Risk Assessment/Calculations:           Physical Exam:    VS:  BP 100/60   Pulse 75   Ht 5' 3.75" (1.619 m)   Wt 126 lb 9.6 oz (57.4 kg)   SpO2 98%   BMI 21.90 kg/m     Wt Readings from Last 3 Encounters:  05/19/23 126 lb 9.6 oz (57.4 kg)  04/29/23 122 lb (55.3 kg)  04/16/23 126 lb 12.8 oz (57.5 kg)     GEN:  Well nourished, well developed in no acute distress HEENT: Normal NECK: No JVD; No carotid bruits LYMPHATICS: No lymphadenopathy CARDIAC: RRR, no murmurs, rubs, gallops. No right groin hematoma or bruit. RESPIRATORY:  Clear to auscultation without rales, wheezing or rhonchi  ABDOMEN: Soft, non-tender, non-distended MUSCULOSKELETAL:  No edema; No deformity  SKIN: Warm and dry NEUROLOGIC:  Alert and oriented x 3 PSYCHIATRIC:  Normal affect   ASSESSMENT:    1. Coronary artery disease involving native coronary artery of native heart without angina pectoris   2. Hyperlipidemia LDL goal <70   3. Nonrheumatic aortic valve insufficiency       PLAN:    In order of problems listed above:  CAD: Previously underwent DES to mid LAD in December 2016. 50-60% bifurcation disease in the LCx. Stress Echo in 2020 was negative for ischemia. Recent cardiac cath with nonobstructive CAD. Continue aspirin, beta-blocker and statin. Follow up in one year  Hyperlipidemia: LDL 42 on lab in Jan  Aortic valve insufficiency: Mild to moderate AI noted during previous stress echo in 2020.  On recent cardiac cath AI was mild. No concerns         Medication Adjustments/Labs and Tests Ordered: Current medicines are reviewed at length with the patient today.   Concerns  regarding medicines are outlined above.  No orders of the defined types were placed in this encounter.  Meds ordered this encounter  Medications   metoprolol succinate (TOPROL-XL) 25 MG 24 hr tablet    Sig: Take 0.5 tablets (12.5 mg total) by mouth daily.    Dispense:  45 tablet    Refill:  3    Pt. Needs to keep appt with Dr. Swaziland in October 2024 for further refills. Thank You   rosuvastatin (CRESTOR) 5 MG tablet    Sig: Take 1 tablet (5 mg total) by mouth daily.    Dispense:  90 tablet    Refill:  3    Please send a replace/new response with 100-Day Supply if appropriate to maximize member benefit. Requesting 1 year supply.   nitroGLYCERIN (NITROSTAT) 0.4 MG SL tablet    Sig: Place 1 tablet (0.4 mg total) under the tongue every 5 (five) minutes as needed for chest pain.    Dispense:  25 tablet    Refill:  6    Patient Instructions  Medication Instructions:  None  *If you need a refill on your cardiac medications before your next appointment, please call your pharmacy*   Lab Work: None If you have labs (blood work) drawn today and your tests are completely normal, you will receive your results only by: MyChart Message (if you have MyChart) OR A paper copy in the mail If you have any lab test that is abnormal or we need to change your treatment, we will call you to review the results.   Testing/Procedures: None    Follow-Up: At [facility], you and your health needs are our priority.  As part of our continuing mission to provide you with exceptional heart care, we have created designated Provider Care Teams.  These Care Teams include your primary Cardiologist (physician) and Advanced Practice Providers (APPs -  Physician Assistants and Nurse Practitioners) who all work together to provide you with the care you need, when you need it.    Your next appointment:   1 year(s)  Provider:   Aleksa Catterton [place], MD     Other Instructions None     Signed, Junice Fei [place], MD  05/19/2023 11:54 AM     [facility]

## 2023-05-19 ENCOUNTER — Ambulatory Visit: Payer: Medicare Other | Attending: Cardiology | Admitting: Cardiology

## 2023-05-19 VITALS — BP 100/60 | HR 75 | Ht 63.75 in | Wt 126.6 lb

## 2023-05-19 DIAGNOSIS — E785 Hyperlipidemia, unspecified: Secondary | ICD-10-CM

## 2023-05-19 DIAGNOSIS — I351 Nonrheumatic aortic (valve) insufficiency: Secondary | ICD-10-CM

## 2023-05-19 DIAGNOSIS — I2510 Atherosclerotic heart disease of native coronary artery without angina pectoris: Secondary | ICD-10-CM | POA: Diagnosis not present

## 2023-05-19 MED ORDER — METOPROLOL SUCCINATE ER 25 MG PO TB24: 12.5000 mg | ORAL_TABLET | Freq: Every day | ORAL | 3 refills | Status: DC

## 2023-05-19 MED ORDER — ROSUVASTATIN CALCIUM 5 MG PO TABS: 5.0000 mg | ORAL_TABLET | Freq: Every day | ORAL | 3 refills | Status: DC

## 2023-05-19 MED ORDER — NITROGLYCERIN 0.4 MG SL SUBL: 0.4000 mg | SUBLINGUAL_TABLET | SUBLINGUAL | 6 refills | Status: AC | PRN

## 2023-05-19 NOTE — Patient Instructions (Signed)
Medication Instructions:  None  *If you need a refill on your cardiac medications before your next appointment, please call your pharmacy*   Lab Work: None If you have labs (blood work) drawn today and your tests are completely normal, you will receive your results only by: MyChart Message (if you have MyChart) OR A paper copy in the mail If you have any lab test that is abnormal or we need to change your treatment, we will call you to review the results.   Testing/Procedures: None    Follow-Up: At [facility], you and your health needs are our priority.  As part of our continuing mission to provide you with exceptional heart care, we have created designated Provider Care Teams.  These Care Teams include your primary Cardiologist (physician) and Advanced Practice Providers (APPs -  Physician Assistants and Nurse Practitioners) who all work together to provide you with the care you need, when you need it.    Your next appointment:   1 year(s)  Provider:   Peter [place], MD     Other Instructions None

## 2023-06-11 ENCOUNTER — Ambulatory Visit: Payer: Medicare Other | Admitting: Adult Health

## 2023-07-06 DIAGNOSIS — L570 Actinic keratosis: Secondary | ICD-10-CM | POA: Diagnosis not present

## 2023-07-06 DIAGNOSIS — L309 Dermatitis, unspecified: Secondary | ICD-10-CM | POA: Diagnosis not present

## 2023-08-30 DIAGNOSIS — L219 Seborrheic dermatitis, unspecified: Secondary | ICD-10-CM | POA: Diagnosis not present

## 2023-08-30 DIAGNOSIS — L570 Actinic keratosis: Secondary | ICD-10-CM | POA: Diagnosis not present

## 2023-10-26 DIAGNOSIS — Z85828 Personal history of other malignant neoplasm of skin: Secondary | ICD-10-CM | POA: Diagnosis not present

## 2023-10-26 DIAGNOSIS — L821 Other seborrheic keratosis: Secondary | ICD-10-CM | POA: Diagnosis not present

## 2023-10-26 DIAGNOSIS — Z872 Personal history of diseases of the skin and subcutaneous tissue: Secondary | ICD-10-CM | POA: Diagnosis not present

## 2023-10-26 DIAGNOSIS — L814 Other melanin hyperpigmentation: Secondary | ICD-10-CM | POA: Diagnosis not present

## 2023-10-26 DIAGNOSIS — L308 Other specified dermatitis: Secondary | ICD-10-CM | POA: Diagnosis not present

## 2023-10-26 DIAGNOSIS — D229 Melanocytic nevi, unspecified: Secondary | ICD-10-CM | POA: Diagnosis not present

## 2023-10-26 DIAGNOSIS — L309 Dermatitis, unspecified: Secondary | ICD-10-CM | POA: Diagnosis not present

## 2023-10-26 DIAGNOSIS — L649 Androgenic alopecia, unspecified: Secondary | ICD-10-CM | POA: Diagnosis not present

## 2023-10-26 DIAGNOSIS — L820 Inflamed seborrheic keratosis: Secondary | ICD-10-CM | POA: Diagnosis not present

## 2023-11-26 DIAGNOSIS — L309 Dermatitis, unspecified: Secondary | ICD-10-CM | POA: Diagnosis not present

## 2023-11-26 DIAGNOSIS — M25522 Pain in left elbow: Secondary | ICD-10-CM | POA: Diagnosis not present

## 2023-11-26 DIAGNOSIS — M25521 Pain in right elbow: Secondary | ICD-10-CM | POA: Diagnosis not present

## 2023-12-20 DIAGNOSIS — M2550 Pain in unspecified joint: Secondary | ICD-10-CM | POA: Diagnosis not present

## 2023-12-20 DIAGNOSIS — Z840 Family history of diseases of the skin and subcutaneous tissue: Secondary | ICD-10-CM | POA: Diagnosis not present

## 2023-12-31 ENCOUNTER — Other Ambulatory Visit: Payer: Self-pay | Admitting: Family Medicine

## 2023-12-31 DIAGNOSIS — Z1231 Encounter for screening mammogram for malignant neoplasm of breast: Secondary | ICD-10-CM

## 2024-01-20 ENCOUNTER — Ambulatory Visit
Admission: RE | Admit: 2024-01-20 | Discharge: 2024-01-20 | Disposition: A | Discharge status: Home / Self Care | Source: Ambulatory Visit | Attending: Family Medicine | Admitting: Family Medicine

## 2024-01-20 DIAGNOSIS — Z1231 Encounter for screening mammogram for malignant neoplasm of breast: Secondary | ICD-10-CM | POA: Diagnosis not present

## 2024-01-27 DIAGNOSIS — H0288A Meibomian gland dysfunction right eye, upper and lower eyelids: Secondary | ICD-10-CM | POA: Diagnosis not present

## 2024-01-27 DIAGNOSIS — H35372 Puckering of macula, left eye: Secondary | ICD-10-CM | POA: Diagnosis not present

## 2024-01-27 DIAGNOSIS — H40012 Open angle with borderline findings, low risk, left eye: Secondary | ICD-10-CM | POA: Diagnosis not present

## 2024-01-27 DIAGNOSIS — H524 Presbyopia: Secondary | ICD-10-CM | POA: Diagnosis not present

## 2024-01-27 DIAGNOSIS — H43813 Vitreous degeneration, bilateral: Secondary | ICD-10-CM | POA: Diagnosis not present

## 2024-01-27 DIAGNOSIS — H5203 Hypermetropia, bilateral: Secondary | ICD-10-CM | POA: Diagnosis not present

## 2024-01-27 DIAGNOSIS — H52223 Regular astigmatism, bilateral: Secondary | ICD-10-CM | POA: Diagnosis not present

## 2024-02-16 DIAGNOSIS — I351 Nonrheumatic aortic (valve) insufficiency: Secondary | ICD-10-CM | POA: Diagnosis not present

## 2024-02-16 DIAGNOSIS — M2550 Pain in unspecified joint: Secondary | ICD-10-CM | POA: Diagnosis not present

## 2024-02-16 DIAGNOSIS — Z0000 Encounter for general adult medical examination without abnormal findings: Secondary | ICD-10-CM | POA: Diagnosis not present

## 2024-02-16 DIAGNOSIS — E611 Iron deficiency: Secondary | ICD-10-CM | POA: Diagnosis not present

## 2024-02-16 DIAGNOSIS — E7800 Pure hypercholesterolemia, unspecified: Secondary | ICD-10-CM | POA: Diagnosis not present

## 2024-02-16 DIAGNOSIS — Z13820 Encounter for screening for osteoporosis: Secondary | ICD-10-CM | POA: Diagnosis not present

## 2024-02-22 ENCOUNTER — Other Ambulatory Visit: Payer: Self-pay | Admitting: Family Medicine

## 2024-02-22 DIAGNOSIS — Z13820 Encounter for screening for osteoporosis: Secondary | ICD-10-CM

## 2024-03-02 DIAGNOSIS — L649 Androgenic alopecia, unspecified: Secondary | ICD-10-CM | POA: Diagnosis not present

## 2024-03-02 DIAGNOSIS — L309 Dermatitis, unspecified: Secondary | ICD-10-CM | POA: Diagnosis not present

## 2024-03-07 ENCOUNTER — Other Ambulatory Visit: Payer: Self-pay | Admitting: Cardiology

## 2024-03-14 DIAGNOSIS — H00025 Hordeolum internum left lower eyelid: Secondary | ICD-10-CM | POA: Diagnosis not present

## 2024-03-24 DIAGNOSIS — H40012 Open angle with borderline findings, low risk, left eye: Secondary | ICD-10-CM | POA: Diagnosis not present

## 2024-04-18 DIAGNOSIS — H40012 Open angle with borderline findings, low risk, left eye: Secondary | ICD-10-CM | POA: Diagnosis not present

## 2024-05-03 ENCOUNTER — Ambulatory Visit

## 2024-05-03 DIAGNOSIS — Z13820 Encounter for screening for osteoporosis: Secondary | ICD-10-CM

## 2024-05-03 DIAGNOSIS — Z780 Asymptomatic menopausal state: Secondary | ICD-10-CM | POA: Diagnosis not present

## 2024-05-03 DIAGNOSIS — M8589 Other specified disorders of bone density and structure, multiple sites: Secondary | ICD-10-CM | POA: Diagnosis not present

## 2024-05-03 DIAGNOSIS — M8580 Other specified disorders of bone density and structure, unspecified site: Secondary | ICD-10-CM | POA: Diagnosis not present

## 2024-05-17 NOTE — Progress Notes (Signed)
 Cardiology Office Note:    Date:  05/22/2024   ID:  [name], DOB Aug 23, 1950, MRN [id number]  PCP:  Sharps Pellet, MD   [facility] Providers Cardiologist:  Keeli Roberg [place], MD     Referring MD: Sharps Pellet, MD   Chief Complaint  Patient presents with   Coronary Artery Disease     History of Present Illness:    [facility] is a 73 y.o. female with a hx of CAD, HLD and GERD.  She is seen today for evaluation of chest pain. Patient was admitted in December 2016 for new onset chest pain.  Cardiac cath on 07/23/2015 showed 95% mid LAD lesion treated with DES (2.5 x 16 mm Synergy), residual 55 to 60% disease in OM1 and OM 2.  She had a stress echo in November 2018 which was normal. Patient presented to [facility] on 08/11/2018 with chest pain.  The symptom was alleviated by nitroglycerin .  Chest x-ray was normal.  EKG showed no acute changes.  D-dimer was negative.  Patient was offered and encouraged admission for continued monitoring and a stress test versus cardiac catheterization however she declined admission.  Troponin was negative x2.  CK-MB and CK were normal as well.  Hemoglobin was normal.  Renal function and electrolyte also normal.  BUN to creatinine ratio was 27.1.  Stress echo in February 2020 revealed EF 55 to 60%, grade 1 DD, moderate aortic valve regurgitation, no echocardiographic finding for stress-induced ischemia. On my review I felt that her aortic regurgitation is mild to moderate.  On follow up this fall she reports she is doing well. Exercising regularly. No chest pain, dyspnea or palpitations. Mother passed this year and she is dealing with estate.   Past Medical History:  Diagnosis Date   Arthritis    OA   Bronchitis    eosinophilic   CAD (coronary artery disease) 07/21/2015   a. 07/2015: 95% stenosis of LAD -->2.5 mm x 16 mm Synergy DES placed, 60% stenosis 1st Mrg, 55% stenosis 2nd Mrg, EF normal   Depression    Depression    GERD  (gastroesophageal reflux disease)     Past Surgical History:  Procedure Laterality Date   CARDIAC CATHETERIZATION N/A 07/23/2015   Procedure: Left Heart Cath and Coronary Angiography;  Surgeon: Alm LELON Clay, MD;  Location: [facility] INVASIVE CV LAB;  Service: Cardiovascular;  Laterality: N/A;   CARDIAC CATHETERIZATION N/A 07/23/2015   Procedure: Coronary Stent Intervention;  Surgeon: Alm LELON Clay, MD;  Location: [facility] INVASIVE CV LAB;  Service: Cardiovascular;  Laterality: N/A;   DILATION AND CURETTAGE OF UTERUS  [phone]   dnc     HERNIA REPAIR     LEFT HEART CATH AND CORONARY ANGIOGRAPHY N/A 04/29/2023   Procedure: LEFT HEART CATH AND CORONARY ANGIOGRAPHY;  Surgeon: [place], Aubriauna Riner M, MD;  [facility];  Service: Cardiovascular;  Laterality: N/A;    Current Medications: Current Meds  Medication Sig   acetaminophen  (TYLENOL ) 650 MG CR tablet Take 1,300 mg by mouth 2 (two) times daily.   aspirin  EC 81 MG tablet Take 1 tablet (81 mg total) by mouth daily.   Calcium  Citrate-Vitamin D  (CITRACAL + D PO) Take 600 mg by mouth 2 (two) times daily.   COLLAGEN-BORON-HYALURONIC ACID PO Take 2 tablets by mouth daily.   DULoxetine (CYMBALTA) 60 MG capsule Take 60 mg by mouth daily.   Melatonin 3 MG CAPS Take by mouth.   minoxidil (ROGAINE) 2 % external solution  Apply 5 % topically at bedtime.   Multiple Vitamin (MULTIVITAMIN) tablet Take 1 tablet by mouth daily.   nitroGLYCERIN  (NITROSTAT ) 0.4 MG SL tablet Place 1 tablet (0.4 mg total) under the tongue every 5 (five) minutes as needed for chest pain.   Turmeric 500 MG CAPS Take 500 mg by mouth in the morning and at bedtime.   vitamin B-12 (CYANOCOBALAMIN ) 1000 MCG tablet Take 1,000 mcg by mouth daily.   zinc gluconate 50 MG tablet Take 50 mg by mouth daily.   [DISCONTINUED] metoprolol  succinate (TOPROL -XL) 25 MG 24 hr tablet TAKE ONE-HALF TABLET BY MOUTH  DAILY   [DISCONTINUED] rosuvastatin  (CRESTOR ) 5 MG tablet TAKE 1 TABLET BY MOUTH  DAILY     Allergies:   Sulfa antibiotics, Atorvastatin , and Sulfonamide derivatives   Social History   Socioeconomic History   Marital status: Married    Spouse name: Not on file   Number of children: Not on file   Years of education: Not on file   Highest education level: Not on file  Occupational History   Not on file  Tobacco Use   Smoking status: Former   Smokeless tobacco: Never  Vaping Use   Vaping status: Never Used  Substance and Sexual Activity   Alcohol use: Yes    Comment: rare   Drug use: No   Sexual activity: Not on file  Other Topics Concern   Not on file  Social History Narrative   Not on file   Social Drivers of Health   Financial Resource Strain: Low Risk  (12/07/2023)   Received from [facility]   Overall Financial Resource Strain (CARDIA)    Difficulty of Paying Living Expenses: Not hard at all  Food Insecurity: No Food Insecurity (12/07/2023)   Received from [facility]   Hunger Vital Sign    Within the past 12 months, you worried that your food would run out before you got the money to buy more.: Never true    Within the past 12 months, the food you bought just didn't last and you didn't have money to get more.: Never true  Transportation Needs: No Transportation Needs (12/07/2023)   Received from [facility] - Transportation    Lack of Transportation (Medical): No    Lack of Transportation (Non-Medical): No  Physical Activity: Sufficiently Active (12/07/2023)   Received from [facility]   Exercise Vital Sign    On average, how many days per week do you engage in moderate to strenuous exercise (like a brisk walk)?: 7 days    On average, how many minutes do you engage in exercise at this level?: 30 min  Stress: Patient Declined (12/07/2023)   Received from [facility] - Occupational Stress Questionnaire    Feeling of Stress : Patient declined  Social Connections: Socially Integrated  (12/07/2023)   Received from [facility]   Social Network    How would you rate your social network (family, work, friends)?: Good participation with social networks     Family History: The patient's family history includes Aortic stenosis in her father; Atrial fibrillation in her mother; Clotting disorder in her sister; Healthy in her brother and sister; Heart attack in her maternal grandfather; Heart failure in her maternal grandmother and mother; Hyperlipidemia in her father; Hypertension in her mother; Macular degeneration in her mother; Skin cancer in her mother; Stroke in her mother; Transient ischemic attack in her mother.  ROS:  Please see the history of present illness.     All other systems reviewed and are negative.  EKGs/Labs/Other Studies Reviewed:    The following studies were reviewed today:  EKG Interpretation Date/Time:  Monday May 22 2024 11:32:22 EDT Ventricular Rate:  72 PR Interval:  136 QRS Duration:  86 QT Interval:  388 QTC Calculation: 424 R Axis:   51  Text Interpretation: Normal sinus rhythm Normal ECG When compared with ECG of 16-Apr-2023 11:36, No significant change was found Confirmed by [place], Abrham Maslowski [phone]) on 05/22/2024 11:43:32 AM   Echo 09/07/2018 LV EF: 55% -   60%  Study Conclusions   - Left ventricle: Systolic function was normal. The estimated    ejection fraction was in the range of 55% to 60%. Doppler    parameters are consistent with abnormal left ventricular    relaxation (grade 1 diastolic dysfunction).  - Aortic valve: There was moderate regurgitation. Regurgitation    pressure half-time: 383 ms.  - Stress ECG conclusions: There were no stress arrhythmias or    conduction abnormalities.  - Staged echo: There was no echocardiographic evidence for    stress-induced ischemia.   Impressions:   - Negative, adequate stress test.    EKG Interpretation Date/Time:  Monday May 22 2024 11:32:22 EDT Ventricular Rate:  72 PR  Interval:  136 QRS Duration:  86 QT Interval:  388 QTC Calculation: 424 R Axis:   51  Text Interpretation: Normal sinus rhythm Normal ECG When compared with ECG of 16-Apr-2023 11:36, No significant change was found Confirmed by [place], Fritz Cauthon [phone]) on 05/22/2024 11:43:32 AM Cardiac cath 04/29/23:  LEFT HEART CATH AND CORONARY ANGIOGRAPHY   Conclusion      1st Mrg lesion is 50% stenosed.   2nd Mrg lesion is 60% stenosed.   Dist RCA lesion is 30% stenosed.   Previously placed Mid LAD stent of unknown type is  widely patent.   The left ventricular systolic function is normal.   LV end diastolic pressure is normal.   The left ventricular ejection fraction is 55-65% by visual estimate.   There is trivial (1+) aortic regurgitation.   Nonobstructive CAD. Patent stent in the mid LAD. No change in OM bifurcation disease compared to 2016.  Normal LV function Normal LVEDP Mild aortic insufficiency. Mild aortic root enlargement   Plan: continue medical therapy.    Recent Labs: No results found for requested labs within last 365 days.  Recent Lipid Panel    Component Value Date/Time   CHOL 121 08/28/2021 0853   TRIG 56 08/28/2021 0853   HDL 66 08/28/2021 0853   CHOLHDL 1.8 08/28/2021 0853   CHOLHDL 1.8 07/22/2016 0940   VLDL 9 07/22/2016 0940   LDLCALC 42 08/28/2021 0853   Dated 01/14/22: cholesterol 138, triglycerides 74, HDL 69, LDL 54. CBC, CMET, TSH normal.  Dated 01/27/23: cholesterol 154, triglycerides 85, HDL 75, LDL 63. Normal CBC, CMET and TSH Dated 02/16/24: cholesterol 136, triglycerides 88, HDL 65, LDL 54. CMET, CBC, TSH normal.   Risk Assessment/Calculations:           Physical Exam:    VS:  BP 114/70 (BP Location: Left Arm, Patient Position: Sitting, Cuff Size: Normal)   Pulse 71   Ht 5' 3.5 (1.613 m)   Wt 130 lb 6.4 oz (59.1 kg)   SpO2 99%   BMI 22.74 kg/m     Wt Readings from Last 3 Encounters:  05/22/24 130 lb 6.4 oz (59.1 kg)  05/19/23  126 lb 9.6 oz  (57.4 kg)  04/29/23 122 lb (55.3 kg)     GEN:  Well nourished, well developed in no acute distress HEENT: Normal NECK: No JVD; No carotid bruits LYMPHATICS: No lymphadenopathy CARDIAC: RRR, no murmurs, rubs, gallops. No right groin hematoma or bruit. RESPIRATORY:  Clear to auscultation without rales, wheezing or rhonchi  ABDOMEN: Soft, non-tender, non-distended MUSCULOSKELETAL:  No edema; No deformity  SKIN: Warm and dry NEUROLOGIC:  Alert and oriented x 3 PSYCHIATRIC:  Normal affect   ASSESSMENT:    1. Coronary artery disease involving native coronary artery of native heart without angina pectoris   2. Hyperlipidemia LDL goal <70   3. Nonrheumatic aortic valve insufficiency        PLAN:    In order of problems listed above:  CAD: Previously underwent DES to mid LAD in December 2016. 50-60% bifurcation disease in the LCx. Stress Echo in 2020 was negative for ischemia. Cardiac cath in Sept 2024 with nonobstructive CAD. Continue aspirin , beta-blocker and statin. Follow up in one year  Hyperlipidemia: LDL 54 on statin  Aortic valve insufficiency: Mild to moderate AI noted during previous stress echo in 2020.  On recent cardiac cath AI was mild. No concerns      Follow up in one year    Medication Adjustments/Labs and Tests Ordered: Current medicines are reviewed at length with the patient today.  Concerns regarding medicines are outlined above.  Orders Placed This Encounter  Procedures   EKG 12-Lead   Meds ordered this encounter  Medications   metoprolol  succinate (TOPROL -XL) 25 MG 24 hr tablet    Sig: Take 0.5 tablets (12.5 mg total) by mouth daily.    Dispense:  45 tablet    Refill:  3    Please send a replace/new response with 100-Day Supply if appropriate to maximize member benefit. Requesting 1 year supply.   rosuvastatin  (CRESTOR ) 5 MG tablet    Sig: Take 1 tablet (5 mg total) by mouth daily.    Dispense:  90 tablet    Refill:  3    Please send a  replace/new response with 100-Day Supply if appropriate to maximize member benefit. Requesting 1 year supply.    Patient Instructions  Medication Instructions:  Continue same medications *If you need a refill on your cardiac medications before your next appointment, please call your pharmacy*  Lab Work: None ordered  Testing/Procedures: None ordered  Follow-Up: At [facility], you and your health needs are our priority.  As part of our continuing mission to provide you with exceptional heart care, our providers are all part of one team.  This team includes your primary Cardiologist (physician) and Advanced Practice Providers or APPs (Physician Assistants and Nurse Practitioners) who all work together to provide you with the care you need, when you need it.  Your next appointment:  1 year   Call in May to schedule Oct appointment     Provider:  Dr.Skylie Hiott  We recommend signing up for the patient portal called MyChart.  Sign up information is provided on this After Visit Summary.  MyChart is used to connect with patients for Virtual Visits (Telemedicine).  Patients are able to view lab/test results, encounter notes, upcoming appointments, etc.  Non-urgent messages can be sent to your provider as well.   To learn more about what you can do with MyChart, go to [email or web address].         Signed, Jahn Franchini [place], MD  05/22/2024 11:50 AM  [facility]

## 2024-05-22 ENCOUNTER — Ambulatory Visit: Attending: Cardiology | Admitting: Cardiology

## 2024-05-22 ENCOUNTER — Encounter: Payer: Self-pay | Admitting: Cardiology

## 2024-05-22 VITALS — BP 114/70 | HR 71 | Ht 63.5 in | Wt 130.4 lb

## 2024-05-22 DIAGNOSIS — I351 Nonrheumatic aortic (valve) insufficiency: Secondary | ICD-10-CM

## 2024-05-22 DIAGNOSIS — E785 Hyperlipidemia, unspecified: Secondary | ICD-10-CM | POA: Diagnosis not present

## 2024-05-22 DIAGNOSIS — I2510 Atherosclerotic heart disease of native coronary artery without angina pectoris: Secondary | ICD-10-CM | POA: Diagnosis not present

## 2024-05-22 MED ORDER — METOPROLOL SUCCINATE ER 25 MG PO TB24: 12.5000 mg | ORAL_TABLET | Freq: Every day | ORAL | 3 refills | Status: AC

## 2024-05-22 MED ORDER — ROSUVASTATIN CALCIUM 5 MG PO TABS: 5.0000 mg | ORAL_TABLET | Freq: Every day | ORAL | 3 refills | Status: AC

## 2024-05-22 NOTE — Patient Instructions (Signed)
